# Patient Record
Sex: Female | Born: 1970 | Race: White | Hispanic: No | Marital: Married | Smoking: Never smoker
Health system: Southern US, Community
[De-identification: ages and names within clinical notes are randomized; demographics above are authoritative.]

## PROBLEM LIST (undated history)

## (undated) DIAGNOSIS — N201 Calculus of ureter: Secondary | ICD-10-CM

## (undated) DIAGNOSIS — Z973 Presence of spectacles and contact lenses: Secondary | ICD-10-CM

## (undated) DIAGNOSIS — Z8619 Personal history of other infectious and parasitic diseases: Secondary | ICD-10-CM

## (undated) DIAGNOSIS — J302 Other seasonal allergic rhinitis: Secondary | ICD-10-CM

## (undated) DIAGNOSIS — T7840XA Allergy, unspecified, initial encounter: Secondary | ICD-10-CM

## (undated) DIAGNOSIS — Z9889 Other specified postprocedural states: Secondary | ICD-10-CM

## (undated) DIAGNOSIS — R112 Nausea with vomiting, unspecified: Secondary | ICD-10-CM

## (undated) DIAGNOSIS — K219 Gastro-esophageal reflux disease without esophagitis: Secondary | ICD-10-CM

## (undated) DIAGNOSIS — F419 Anxiety disorder, unspecified: Secondary | ICD-10-CM

## (undated) DIAGNOSIS — R351 Nocturia: Secondary | ICD-10-CM

## (undated) DIAGNOSIS — Z87898 Personal history of other specified conditions: Secondary | ICD-10-CM

## (undated) DIAGNOSIS — K9041 Non-celiac gluten sensitivity: Secondary | ICD-10-CM

## (undated) DIAGNOSIS — E079 Disorder of thyroid, unspecified: Secondary | ICD-10-CM

## (undated) DIAGNOSIS — J45991 Cough variant asthma: Secondary | ICD-10-CM

## (undated) HISTORY — DX: Allergy, unspecified, initial encounter: T78.40XA

## (undated) HISTORY — DX: Other seasonal allergic rhinitis: J30.2

## (undated) HISTORY — DX: Personal history of other infectious and parasitic diseases: Z86.19

## (undated) HISTORY — DX: Anxiety disorder, unspecified: F41.9

## (undated) HISTORY — DX: Disorder of thyroid, unspecified: E07.9

## (undated) HISTORY — PX: COLONOSCOPY: SHX174

---

## 1990-03-08 HISTORY — PX: GYNECOLOGIC CRYOSURGERY: SHX857

## 1998-11-07 HISTORY — PX: OTHER SURGICAL HISTORY: SHX169

## 1998-11-19 ENCOUNTER — Encounter (INDEPENDENT_AMBULATORY_CARE_PROVIDER_SITE_OTHER): Payer: Self-pay

## 1998-11-19 ENCOUNTER — Ambulatory Visit (HOSPITAL_COMMUNITY): Admission: RE | Admit: 1998-11-19 | Discharge: 1998-11-19 | Payer: Self-pay | Admitting: Obstetrics and Gynecology

## 1999-10-23 ENCOUNTER — Emergency Department (HOSPITAL_COMMUNITY): Admission: EM | Admit: 1999-10-23 | Discharge: 1999-10-23 | Payer: Self-pay | Admitting: Emergency Medicine

## 1999-10-23 ENCOUNTER — Encounter: Payer: Self-pay | Admitting: Internal Medicine

## 2000-06-20 ENCOUNTER — Other Ambulatory Visit: Admission: RE | Admit: 2000-06-20 | Discharge: 2000-06-20 | Payer: Self-pay | Admitting: Obstetrics and Gynecology

## 2000-11-29 ENCOUNTER — Inpatient Hospital Stay (HOSPITAL_COMMUNITY): Admission: AD | Admit: 2000-11-29 | Discharge: 2000-11-29 | Payer: Self-pay | Admitting: Obstetrics and Gynecology

## 2000-12-01 ENCOUNTER — Inpatient Hospital Stay (HOSPITAL_COMMUNITY): Admission: AD | Admit: 2000-12-01 | Discharge: 2000-12-01 | Payer: Self-pay | Admitting: Obstetrics and Gynecology

## 2000-12-02 ENCOUNTER — Inpatient Hospital Stay (HOSPITAL_COMMUNITY): Admission: AD | Admit: 2000-12-02 | Discharge: 2000-12-02 | Payer: Self-pay | Admitting: Obstetrics & Gynecology

## 2000-12-14 ENCOUNTER — Inpatient Hospital Stay (HOSPITAL_COMMUNITY): Admission: AD | Admit: 2000-12-14 | Discharge: 2000-12-31 | Payer: Self-pay | Admitting: *Deleted

## 2000-12-14 ENCOUNTER — Encounter (INDEPENDENT_AMBULATORY_CARE_PROVIDER_SITE_OTHER): Payer: Self-pay

## 2000-12-19 ENCOUNTER — Encounter: Payer: Self-pay | Admitting: Obstetrics and Gynecology

## 2000-12-22 ENCOUNTER — Encounter: Payer: Self-pay | Admitting: Obstetrics and Gynecology

## 2000-12-26 ENCOUNTER — Encounter: Payer: Self-pay | Admitting: Obstetrics and Gynecology

## 2001-01-01 ENCOUNTER — Encounter: Admission: RE | Admit: 2001-01-01 | Discharge: 2001-01-31 | Payer: Self-pay | Admitting: Obstetrics and Gynecology

## 2001-01-26 ENCOUNTER — Other Ambulatory Visit: Admission: RE | Admit: 2001-01-26 | Discharge: 2001-01-26 | Payer: Self-pay | Admitting: Obstetrics and Gynecology

## 2001-02-01 ENCOUNTER — Encounter: Admission: RE | Admit: 2001-02-01 | Discharge: 2001-03-03 | Payer: Self-pay | Admitting: Obstetrics and Gynecology

## 2001-04-03 ENCOUNTER — Encounter: Admission: RE | Admit: 2001-04-03 | Discharge: 2001-05-03 | Payer: Self-pay | Admitting: Obstetrics and Gynecology

## 2001-05-04 ENCOUNTER — Encounter: Admission: RE | Admit: 2001-05-04 | Discharge: 2001-06-03 | Payer: Self-pay | Admitting: Obstetrics and Gynecology

## 2002-01-23 ENCOUNTER — Other Ambulatory Visit: Admission: RE | Admit: 2002-01-23 | Discharge: 2002-01-23 | Payer: Self-pay | Admitting: Obstetrics and Gynecology

## 2002-03-08 HISTORY — PX: CHOLECYSTECTOMY: SHX55

## 2002-12-25 ENCOUNTER — Encounter: Admission: RE | Admit: 2002-12-25 | Discharge: 2002-12-25 | Payer: Self-pay | Admitting: *Deleted

## 2002-12-25 ENCOUNTER — Encounter: Payer: Self-pay | Admitting: *Deleted

## 2002-12-26 ENCOUNTER — Emergency Department (HOSPITAL_COMMUNITY): Admission: EM | Admit: 2002-12-26 | Discharge: 2002-12-26 | Payer: Self-pay | Admitting: *Deleted

## 2002-12-26 ENCOUNTER — Encounter: Payer: Self-pay | Admitting: Emergency Medicine

## 2003-01-25 ENCOUNTER — Other Ambulatory Visit: Admission: RE | Admit: 2003-01-25 | Discharge: 2003-01-25 | Payer: Self-pay | Admitting: Obstetrics and Gynecology

## 2004-01-08 ENCOUNTER — Emergency Department (HOSPITAL_COMMUNITY): Admission: EM | Admit: 2004-01-08 | Discharge: 2004-01-08 | Payer: Self-pay | Admitting: Emergency Medicine

## 2006-03-08 HISTORY — PX: INTRAUTERINE DEVICE INSERTION: SHX323

## 2007-08-16 ENCOUNTER — Encounter: Admission: RE | Admit: 2007-08-16 | Discharge: 2007-08-16 | Payer: Self-pay | Admitting: Family Medicine

## 2008-05-08 ENCOUNTER — Emergency Department (HOSPITAL_COMMUNITY): Admission: EM | Admit: 2008-05-08 | Discharge: 2008-05-08 | Payer: Self-pay | Admitting: Emergency Medicine

## 2010-07-24 NOTE — H&P (Signed)
[facility]  Patient:    Carmen Simon, Carmen Simon Visit Number: [id number] MRN: [id number]          Service Type: OBS Location: 910D 9127 01 Attending Physician:  Ermalene Searing Dictated by:   Lenoard Aden, M.D. Admit Date:  12/14/2000   CC:         [facility]   History and Physical  CHIEF COMPLAINT:              Rule out preeclampsia.  HISTORY OF PRESENT ILLNESS:   A 40 year old white female, G1, P0, EDD February 01, 2001, at 33-0/7 week twin, dichorionic-diamniotic gestation who presents for persistent abnormal LFTs, elevated blood pressure, to rule out preeclampsia.  PAST MEDICAL HISTORY:         Remarkable for a noncontributory obstetric history.  Remarkable for migraine headaches and mitral valve prolapse previously premedicating with oral surgery.  ALLERGIES:                    ASPIRIN and ERYTHROMYCIN.  MEDICATIONS:                  Prenatal vitamins and Procardia.  PAST SURGICAL HISTORY:        Cryosurgery in 1992, D&C, hysteroscopy, polypectomy in September 2000.  GYNECOLOGIC HISTORY:          History of conception on Clomid, history of HPV.  FAMILY HISTORY:               Tuberculosis, diet-controlled diabetes.  PRENATAL LABORATORY DATA:     Blood type A negative, Rh antibody negative, rubella immune, hepatitis B surface antigen negative, HIV nonreactive, and GC and chlamydia negative.  Triple screen performed at 16 to 18 weeks reportedly normal for a twin gestation.  PREGNANCY ISSUES:              Preterm cervical change treated with Procardia.  Gestational hypertension stable.  Discordant growth between twin A and twin B. Fetal positioning: Twin A is vertex; twin B is breach.  Fetal arrhythmia of twin A with normal targeted ultrasound and fetal echocardiogram.  Abnormal liver function tests.  SOCIAL HISTORY:               Noncontributory.  PHYSICAL EXAMINATION:  GENERAL:                      She is a well-developed,  well-nourished white female in no apparent distress.  VITAL SIGNS:                  Blood pressure 140/86.  HEENT:                        Normal.  LUNGS:                        Clear.  HEART:                        Regular rate and rhythm.  ABDOMEN:                      Soft, gravid, nontender.  Fundal height 40.  No right upper quadrant tenderness noted.  PELVIC:                       Cervix is 2 cm, long, soft vertex, and -1.  EXTREMITIES:                  DTRs 1 to 2+, no evidence of clonus.  LABORATORY DATA:              Surveillance reveals a biophysical profile of 8/8 of twin A with a normal AFI, and 6/8 twin B.  Most recent sonographic estimates of fetal weight reveal an estimated fetal weight on December 07, 2000, of twin A of 4 pounds 4 ounces and twin B of 3 pounds 11 ounces.  LFTs obtained on December 12, 2000, reveal an SGOT of 46, mildly elevated; SGPT 123.  Otherwise normal CBC and PIH labs.  Elevations of SGPT have been persistent with mild elevations noted over a two-week period since December 01, 2000.  IMPRESSION:                   1. A 33-week twin dichorionic-diamniotic                                  gestation.                               2. Gestational hypertension with good response                                  to Procardia.                               3. Preterm cervical change.                               4. Rhesus (Rh) negative.                               5. Fetal arrhythmia of twin A.                               6. Vertex-breach presentation.  PLAN:                         Admit to [facility] for expectant management, 24-hour urine, serial CBC and PIH labs.  Continue Procardia at this time.  NICU consultation ordered. If active labor ensues, the patient desires primary C section due to fetal positioning considerations and does not desire any possibility for attempts at breach delivery. Dictated by:   Lenoard Aden,  M.D. Attending Physician:  Marina Gravel B DD:  12/14/00 TD:  12/14/00 Job: [id number]

## 2010-07-24 NOTE — Discharge Summary (Signed)
[facility]  Patient:    AIKA, BRZOSKA Visit Number: [id number] MRN: [id number]          Service Type: OBS Location: 910A 9111 01 Attending Physician:  Ermalene Searing Admit Date:  12/14/2000 Discharge Date: 12/31/2000                             Discharge Summary  HISTORY OF PRESENT ILLNESS:   The patient was admitted for atypical preeclampsia on December 14, 2000, for abnormal LFTs.  All other preeclamptic surveillance within normal limits.  HOSPITAL COURSE:              She was followed in the hospital due to the abnormal liver function tests and borderline blood pressure.  She remained on Procardia.  She had ongoing reassuring surveillance for her twins.  She remained on Procardia until 35 weeks, at which time her laboratory values continued to worsen.  Her blood pressure elevated and IUGR of twin B was noted. Therefore, she underwent an uncomplicated primary low transverse cesarean section for twins on December 28, 2000.  Postoperatively, she was not placed on magnesium sulfate due to resolution of her blood pressures and improvement in her lab values.  She was discharged to home on postoperative day #3 with marked improvement noted in her liver function tests.  She was given Motrin and Tylox for pain.  Preeclamptic warnings were given.  FOLLOW-UP:                    Follow up in the office in one week. Attending Physician:  Marina Gravel B DD:  01/23/01 TD:  01/23/01 Job: [id number]

## 2010-07-24 NOTE — Op Note (Signed)
[facility]  Patient:    Carmen Simon, Carmen Simon Visit Number: [id number] MRN: [id number]          Service Type: OBS Location: 9400 9178 01 Attending Physician:  Ermalene Searing Dictated by:   Lenoard Aden, M.D. Proc. Date: 12/28/00 Admit Date:  12/14/2000   CC:         [facility]   Operative Report  PREOPERATIVE DIAGNOSES:       1. A 35-week twin intrauterine pregnancy.                               2. Third trimester bleeding.                               3. Atypical preeclampsia with abnormal liver                                  enzymes.                               4. Malpresentation of Twin B.                               5. Intrauterine growth restriction Twin B.  POSTOPERATIVE DIAGNOSES:      1. A 35-week twin intrauterine pregnancy.                               2. Third trimester bleeding.                               3. Atypical preeclampsia with abnormal liver                                  enzymes.                               4. Malpresentation of Twin B.                               5. Intrauterine growth restriction Twin B.  PROCEDURE:                    Primary low segment transverse cesarean section.  SURGEON:                      Lenoard Aden, M.D.  ASSISTANT:                    Silverio Lay, M.D.  ANESTHESIA:                   Spinal by Dr. Jean Rosenthal.  ESTIMATED BLOOD LOSS:         1000 cc.  DRAINS:                       Foley.  COUNTS:  Correct.  DISPOSITION:                  The patient to recovery in good condition.  FINDINGS:                     Full-term living female, vertex presenting, Apgars 6 and 7 to the NICU, and full-term living female, breech position, Apgars 8 and 9, to the NICU for size restrictions. Placenta posterior, intact. Normal tubes and ovaries.  DESCRIPTION OF PROCEDURE:     After being apprised of the risks of anesthesia, infection, bleeding, injury to  abdominal organs with need for repair, the patient was brought to the operating room where she was administered general anesthetic without complications, prepped and draped in the usual sterile fashion, and Foley catheter placed. After achieving adequate anesthesia, Marcaine solution was placed in the area of the Pfannenstiel incision, the fascia skin incision made with scalpel, carried down to the fascia which was nicked in the midline and opened transversely using Mayo scissors. Rectus muscles dissected sharply in the midline, peritoneum entered sharply, and bladder blade placed. Visceral peritoneum scored in a a smile-like fashion. Uterus scored in a smile-like fashion, amniotomy for clear fluid and delivery of Twin A from vertex presentation, handed to the pediatricians in attendance. Apgars were 6 and 7. Twin B, frank breech positioning, amniotomy for clear fluid, delivered using standard maneuvers for the delivery of the fetal breech with flexion of the fetal vertex, bulb suctioning upon delivery, and handed to the pediatricians in attendance. Apgars were 8 and 9. Placenta was posterior, lateral, and intact, sent to pathology after cord blood collected. The uterus was exteriorized. Normal tubes and ovaries and normal uterine cavity noted. The uterus was closed using a O Monocryl in continuous running fashion. A second imbricating layer was placed. The bladder flap was inspected. Paracolic gutters were irrigated and all blood clots subsequently removed. The uterus was replaced. Good hemostasis was noted along the uterine incision as well as the bladder flap. Exploration revealed no evidence of peritoneal edge bleeding. The fascia was then closed using a O Vicryl in continuous running fashion. The skin was closed using staples. The patient tolerated the procedure well and was transferred to recovery in good condition. Dictated by:   Lenoard Aden, M.D. Attending Physician:  Marina Gravel B DD:  12/28/00 TD:  12/28/00 Job: [id number]

## 2010-10-27 ENCOUNTER — Other Ambulatory Visit (HOSPITAL_COMMUNITY): Payer: Self-pay | Admitting: Family Medicine

## 2010-10-27 ENCOUNTER — Encounter (INDEPENDENT_AMBULATORY_CARE_PROVIDER_SITE_OTHER): Payer: BC Managed Care – PPO

## 2010-10-27 ENCOUNTER — Ambulatory Visit (HOSPITAL_COMMUNITY): Payer: BC Managed Care – PPO | Attending: Family Medicine

## 2010-10-27 DIAGNOSIS — R609 Edema, unspecified: Secondary | ICD-10-CM

## 2010-10-27 DIAGNOSIS — R002 Palpitations: Secondary | ICD-10-CM

## 2010-10-27 DIAGNOSIS — E669 Obesity, unspecified: Secondary | ICD-10-CM | POA: Insufficient documentation

## 2010-10-27 HISTORY — PX: TRANSTHORACIC ECHOCARDIOGRAM: SHX275

## 2010-10-28 ENCOUNTER — Encounter (HOSPITAL_COMMUNITY): Payer: Self-pay | Admitting: Cardiology

## 2011-04-06 ENCOUNTER — Other Ambulatory Visit: Payer: Self-pay | Admitting: Obstetrics and Gynecology

## 2011-04-06 DIAGNOSIS — Z1231 Encounter for screening mammogram for malignant neoplasm of breast: Secondary | ICD-10-CM

## 2011-04-14 ENCOUNTER — Ambulatory Visit
Admission: RE | Admit: 2011-04-14 | Discharge: 2011-04-14 | Disposition: A | Discharge status: Home / Self Care | Payer: BC Managed Care – PPO | Source: Ambulatory Visit | Attending: Obstetrics and Gynecology | Admitting: Obstetrics and Gynecology

## 2011-04-14 DIAGNOSIS — Z1231 Encounter for screening mammogram for malignant neoplasm of breast: Secondary | ICD-10-CM

## 2012-04-05 ENCOUNTER — Other Ambulatory Visit: Payer: Self-pay | Admitting: Obstetrics and Gynecology

## 2012-04-05 DIAGNOSIS — Z1231 Encounter for screening mammogram for malignant neoplasm of breast: Secondary | ICD-10-CM

## 2012-05-01 ENCOUNTER — Ambulatory Visit
Admission: RE | Admit: 2012-05-01 | Discharge: 2012-05-01 | Disposition: A | Discharge status: Home / Self Care | Payer: BC Managed Care – PPO | Source: Ambulatory Visit | Attending: Obstetrics and Gynecology | Admitting: Obstetrics and Gynecology

## 2012-05-01 DIAGNOSIS — Z1231 Encounter for screening mammogram for malignant neoplasm of breast: Secondary | ICD-10-CM

## 2012-06-29 DIAGNOSIS — G5621 Lesion of ulnar nerve, right upper limb: Secondary | ICD-10-CM | POA: Insufficient documentation

## 2012-06-29 DIAGNOSIS — S63599A Other specified sprain of unspecified wrist, initial encounter: Secondary | ICD-10-CM | POA: Insufficient documentation

## 2012-07-06 HISTORY — PX: ULNAR TUNNEL RELEASE: SHX820

## 2013-06-15 ENCOUNTER — Other Ambulatory Visit: Payer: Self-pay

## 2013-06-15 DIAGNOSIS — Z1231 Encounter for screening mammogram for malignant neoplasm of breast: Secondary | ICD-10-CM

## 2013-06-25 ENCOUNTER — Ambulatory Visit: Payer: BC Managed Care – PPO

## 2013-06-27 ENCOUNTER — Ambulatory Visit
Admission: RE | Admit: 2013-06-27 | Discharge: 2013-06-27 | Disposition: A | Discharge status: Home / Self Care | Payer: BC Managed Care – PPO | Source: Ambulatory Visit

## 2013-06-27 DIAGNOSIS — Z1231 Encounter for screening mammogram for malignant neoplasm of breast: Secondary | ICD-10-CM

## 2013-07-10 ENCOUNTER — Other Ambulatory Visit: Payer: Self-pay | Admitting: Dermatology

## 2014-03-11 ENCOUNTER — Emergency Department (HOSPITAL_COMMUNITY)
Admission: EM | Admit: 2014-03-11 | Discharge: 2014-03-11 | Disposition: A | Discharge status: Home / Self Care | Payer: BC Managed Care – PPO | Attending: Emergency Medicine | Admitting: Emergency Medicine

## 2014-03-11 ENCOUNTER — Emergency Department (HOSPITAL_COMMUNITY): Payer: BC Managed Care – PPO

## 2014-03-11 ENCOUNTER — Encounter (HOSPITAL_COMMUNITY): Payer: Self-pay | Admitting: Emergency Medicine

## 2014-03-11 DIAGNOSIS — N200 Calculus of kidney: Secondary | ICD-10-CM | POA: Diagnosis not present

## 2014-03-11 DIAGNOSIS — Z3202 Encounter for pregnancy test, result negative: Secondary | ICD-10-CM | POA: Insufficient documentation

## 2014-03-11 DIAGNOSIS — J45909 Unspecified asthma, uncomplicated: Secondary | ICD-10-CM | POA: Diagnosis not present

## 2014-03-11 DIAGNOSIS — R1032 Left lower quadrant pain: Secondary | ICD-10-CM | POA: Diagnosis present

## 2014-03-11 LAB — CBC WITH DIFFERENTIAL/PLATELET
Basophils Absolute: 0 10*3/uL (ref 0.0–0.1)
Basophils Relative: 0 % (ref 0–1)
EOS ABS: 0.2 10*3/uL (ref 0.0–0.7)
EOS PCT: 1 % (ref 0–5)
HEMATOCRIT: 39.2 % (ref 36.0–46.0)
HEMOGLOBIN: 12.8 g/dL (ref 12.0–15.0)
Lymphocytes Relative: 22 % (ref 12–46)
Lymphs Abs: 2.6 10*3/uL (ref 0.7–4.0)
MCH: 29.2 pg (ref 26.0–34.0)
MCHC: 32.7 g/dL (ref 30.0–36.0)
MCV: 89.5 fL (ref 78.0–100.0)
Monocytes Absolute: 0.9 10*3/uL (ref 0.1–1.0)
Monocytes Relative: 7 % (ref 3–12)
NEUTROS PCT: 70 % (ref 43–77)
Neutro Abs: 8.4 10*3/uL — ABNORMAL HIGH (ref 1.7–7.7)
PLATELETS: 308 10*3/uL (ref 150–400)
RBC: 4.38 MIL/uL (ref 3.87–5.11)
RDW: 13.3 % (ref 11.5–15.5)
WBC: 12.1 10*3/uL — AB (ref 4.0–10.5)

## 2014-03-11 LAB — I-STAT CHEM 8, ED
BUN: 17 mg/dL (ref 6–23)
CHLORIDE: 104 meq/L (ref 96–112)
CREATININE: 0.8 mg/dL (ref 0.50–1.10)
Calcium, Ion: 1.07 mmol/L — ABNORMAL LOW (ref 1.12–1.23)
Glucose, Bld: 127 mg/dL — ABNORMAL HIGH (ref 70–99)
HCT: 37 % (ref 36.0–46.0)
Hemoglobin: 12.6 g/dL (ref 12.0–15.0)
POTASSIUM: 3.6 mmol/L (ref 3.5–5.1)
SODIUM: 138 mmol/L (ref 135–145)
TCO2: 20 mmol/L (ref 0–100)

## 2014-03-11 LAB — URINALYSIS, ROUTINE W REFLEX MICROSCOPIC
Bilirubin Urine: NEGATIVE
Glucose, UA: NEGATIVE mg/dL
Ketones, ur: NEGATIVE mg/dL
NITRITE: NEGATIVE
PH: 7.5 (ref 5.0–8.0)
Protein, ur: 30 mg/dL — AB
SPECIFIC GRAVITY, URINE: 1.025 (ref 1.005–1.030)
UROBILINOGEN UA: 1 mg/dL (ref 0.0–1.0)

## 2014-03-11 LAB — URINE MICROSCOPIC-ADD ON

## 2014-03-11 LAB — POC URINE PREG, ED: PREG TEST UR: NEGATIVE

## 2014-03-11 MED ORDER — MORPHINE SULFATE 4 MG/ML IJ SOLN
4.0000 mg | Freq: Once | INTRAMUSCULAR | Status: AC
  Administered 2014-03-11: 4 mg via INTRAVENOUS
  Filled 2014-03-11: qty 1

## 2014-03-11 MED ORDER — SODIUM CHLORIDE 0.9 % IV BOLUS (SEPSIS)
1000.0000 mL | Freq: Once | INTRAVENOUS | Status: AC
  Administered 2014-03-11: 1000 mL via INTRAVENOUS

## 2014-03-11 MED ORDER — KETOROLAC TROMETHAMINE 30 MG/ML IJ SOLN
30.0000 mg | Freq: Once | INTRAMUSCULAR | Status: AC
Start: 2014-03-11 — End: 2014-03-11
  Administered 2014-03-11: 30 mg via INTRAVENOUS
  Filled 2014-03-11: qty 1

## 2014-03-11 MED ORDER — ONDANSETRON HCL 4 MG/2ML IJ SOLN
4.0000 mg | Freq: Once | INTRAMUSCULAR | Status: AC
Start: 2014-03-11 — End: 2014-03-11
  Administered 2014-03-11: 4 mg via INTRAVENOUS
  Filled 2014-03-11: qty 2

## 2014-03-11 NOTE — ED Notes (Addendum)
Awake. Verbally responsive. A/O x4. Resp even and unlabored. No audible adventitious breath sounds noted. ABC's intact. Lt flank pain that radiates to LLQ area with nausea. No hematuria. Denies dysuria. Denies urinary frequency/urgency, burning/pressure with void.

## 2014-03-11 NOTE — ED Notes (Signed)
Awake. Verbally responsive. A/O x4. Resp even and unlabored. No audible adventitious breath sounds noted. ABC's intact. NAD noted. 

## 2014-03-11 NOTE — ED Notes (Signed)
Awake. Verbally responsive. A/O x4. Resp even and unlabored. No audible adventitious breath sounds noted. ABC's intact.  

## 2014-03-11 NOTE — Discharge Instructions (Signed)
Kidney Stones Carmen Simon, you were seen today for abdominal pain. This is likely due to kidney stones. Your ultrasound did not show significant swelling of your kidneys. Continue to take Motrin at home as needed for pain and follow-up with urology for continued management. If symptoms worsen come back to the emergency department immediately. Thank you. Kidney stones (urolithiasis) are solid masses that form inside your kidneys. The intense pain is caused by the stone moving through the kidney, ureter, bladder, and urethra (urinary tract). When the stone moves, the ureter starts to spasm around the stone. The stone is usually passed in your pee (urine).  HOME CARE  Drink enough fluids to keep your pee clear or pale yellow. This helps to get the stone out.  Strain all pee through the provided strainer. Do not pee without peeing through the strainer, not even once. If you pee the stone out, catch it in the strainer. The stone may be as small as a grain of salt. Take this to your doctor. This will help your doctor figure out what you can do to try to prevent more kidney stones.  Only take medicine as told by your doctor.  Follow up with your doctor as told.  Get follow-up X-rays as told by your doctor. GET HELP IF: You have pain that gets worse even if you have been taking pain medicine. GET HELP RIGHT AWAY IF:   Your pain does not get better with medicine.  You have a fever or shaking chills.  Your pain increases and gets worse over 18 hours.  You have new belly (abdominal) pain.  You feel faint or pass out.  You are unable to pee. MAKE SURE YOU:   Understand these instructions.  Will watch your condition.  Will get help right away if you are not doing well or get worse. Document Released: 08/11/2007 Document Revised: 10/25/2012 Document Reviewed: 07/26/2012 [facility] Patient Information 2015 [place], [place]. This information is not intended to replace advice given to you by your  health care provider. Make sure you discuss any questions you have with your health care provider.

## 2014-03-11 NOTE — ED Provider Notes (Signed)
CSN: [id number]     Arrival date & time 03/11/14  0241 History   First MD Initiated Contact with Patient 03/11/14 [phone]     Chief Complaint  Patient presents with  . Flank Pain     (Consider location/radiation/quality/duration/timing/severity/associated sxs/prior Treatment) HPI  Carmen Simon is a 44 y.o. female with past medical history of asthma, nephrolithiasis coming in with sudden onset left sided abdominal pain. Patient states this occurred at 1 AM while sleeping and was worse in her left lower quadrant. The pain was sharp. It radiated into her left flank, and now radiates into her left groin. Pain has changed to a dull constant pain. She's had nausea with no vomiting. States her urine has been darker, she denies dysuria or hematuria. Bowel movements have been normal. She states she's had an abnormal menstrual cycle this month, she has an IUD in place. She denies any abnormal vaginal discharge. She's had no fevers or recent infections. Patient denies any history of pain like this. She had a kidney stone 3 years ago on the right side which was 1 mm and she suspects that it passed spontaneously.  She had no further issues with this at that time.  10 Systems reviewed and are negative for acute change except as noted in the HPI.      Past Medical History  Diagnosis Date  . Asthma    No past surgical history on file. No family history on file. History  Substance Use Topics  . Smoking status: Never Smoker   . Smokeless tobacco: Never Used  . Alcohol Use: Yes   OB History    No data available     Review of Systems    Allergies  Aspirin and Gluten meal  Home Medications   Prior to Admission medications   Not on File   BP 158/85 mmHg  Pulse 89  Temp(Src) 97.4 F (36.3 C) (Oral)  Resp 20  Ht 5\' 4"  (1.626 m)  Wt 184 lb (83.462 kg)  BMI 31.57 kg/m2  SpO2 100% Physical Exam  Constitutional: She is oriented to person, place, and time. She appears well-developed and  well-nourished. No distress.  HENT:  Head: Normocephalic and atraumatic.  Nose: Nose normal.  Mouth/Throat: Oropharynx is clear and moist. No oropharyngeal exudate.  Eyes: Conjunctivae and EOM are normal. Pupils are equal, round, and reactive to light. No scleral icterus.  Neck: Normal range of motion. Neck supple. No JVD present. No tracheal deviation present. No thyromegaly present.  Cardiovascular: Normal rate, regular rhythm and normal heart sounds.  Exam reveals no gallop and no friction rub.   No murmur heard. Pulmonary/Chest: Effort normal and breath sounds normal. No respiratory distress. She has no wheezes. She exhibits no tenderness.  Abdominal: Soft. Bowel sounds are normal. She exhibits no distension and no mass. There is no tenderness. There is no rebound and no guarding.  No CVA tenderness  Musculoskeletal: Normal range of motion. She exhibits no edema or tenderness.  Lymphadenopathy:    She has no cervical adenopathy.  Neurological: She is alert and oriented to person, place, and time. No cranial nerve deficit. She exhibits normal muscle tone.  Skin: Skin is warm and dry. No rash noted. She is not diaphoretic. No erythema. No pallor.  Nursing note and vitals reviewed.   ED Course  Procedures (including critical care time) Labs Review Labs Reviewed  CBC WITH DIFFERENTIAL - Abnormal; Notable for the following:    WBC 12.1 (*)    Neutro  Abs 8.4 (*)    All other components within normal limits  URINALYSIS, ROUTINE W REFLEX MICROSCOPIC - Abnormal; Notable for the following:    APPearance CLOUDY (*)    Hgb urine dipstick LARGE (*)    Protein, ur 30 (*)    Leukocytes, UA SMALL (*)    All other components within normal limits  URINE MICROSCOPIC-ADD ON - Abnormal; Notable for the following:    Bacteria, UA FEW (*)    All other components within normal limits  I-STAT CHEM 8, ED - Abnormal; Notable for the following:    Glucose, Bld 127 (*)    Calcium, Ion 1.07 (*)    All  other components within normal limits  URINE CULTURE  POC URINE PREG, ED    Imaging Review US Renal  03/11/2014   CLINICAL DATA:  Left flank pain.  EXAM: RENAL/URINARY TRACT ULTRASOUND COMPLETE  COMPARISON:  None.  FINDINGS: Right Kidney:  Length: 10.4 cm. Echogenicity within normal limits. No mass or hydronephrosis visualized.  Left Kidney:  Length: 11.1 cm. Echogenicity within normal limits. No mass or hydronephrosis visualized.  Bladder:  Appears normal for degree of bladder distention. No wall thickening or filling defect identified appear  IMPRESSION: Normal ultrasound appearance of the kidneys and bladder.   Electronically Signed   By: Lucienne Capers M.D.   On: 03/11/2014 05:08     EKG Interpretation None      MDM   Final diagnoses:  Nephrolithiasis    Patient since emergency department for sudden onset left-sided flank pain with radiation into her groin. History is consistent with nephrolithiasis. She was given IV fluids, morphine, Toradol and Zofran for relief. Renal ultrasound is currently pending. Also will obtain hCG as she has had abnormal vaginal bleeding this month and IUD puts her at increased risk for ectopic pregnancy.  Place a test is negative, renal ultrasound does not show any hydronephrosis. After morphine and Toradol patient's symptoms have improved, she is currently denying pain. She is advised to continue Motrin at home as needed for pain. She'll be given urology follow-up, her vital signs were within her normal limits and she is safe for discharge.   [name], MD 03/11/14 [phone]

## 2014-03-11 NOTE — ED Notes (Signed)
Oni, MD at bedside.  

## 2014-03-11 NOTE — ED Notes (Signed)
Pt arrived to the ED with a complaint of left sided flank pain.  Pt is present in the left lower pelvic area and the left lower back area.  Pt was awoke today around 0100 hrs.  Pt describes the pain as constant and sharp.

## 2014-03-11 NOTE — ED Notes (Signed)
Awake. Verbally responsive. A/O x4. Resp even and unlabored. No audible adventitious breath sounds noted. ABC's intact. IV saline lock patent and intact. IV flds infused without difficulty. Family at bedside.

## 2014-03-12 LAB — URINE CULTURE

## 2014-04-09 ENCOUNTER — Other Ambulatory Visit: Payer: Self-pay | Admitting: Urology

## 2014-04-16 ENCOUNTER — Encounter (HOSPITAL_BASED_OUTPATIENT_CLINIC_OR_DEPARTMENT_OTHER): Payer: Self-pay | Admitting: *Deleted

## 2014-04-16 NOTE — Progress Notes (Signed)
NPO AFTER MN. ARRIVE AT 0930. NEEDS ISTAT AND URINE PREG. WILL TAKE ZYRTEC AND FLOMAX AM DOS W/ SIPS OF WATER.

## 2014-04-18 ENCOUNTER — Ambulatory Visit (HOSPITAL_BASED_OUTPATIENT_CLINIC_OR_DEPARTMENT_OTHER): Payer: BC Managed Care – PPO | Admitting: Anesthesiology

## 2014-04-18 ENCOUNTER — Other Ambulatory Visit: Payer: Self-pay | Admitting: Urology

## 2014-04-18 ENCOUNTER — Encounter (HOSPITAL_BASED_OUTPATIENT_CLINIC_OR_DEPARTMENT_OTHER)
Admission: RE | Disposition: A | Discharge status: Home / Self Care | Payer: Self-pay | Source: Ambulatory Visit | Attending: Urology

## 2014-04-18 ENCOUNTER — Ambulatory Visit (HOSPITAL_COMMUNITY)
Admission: RE | Admit: 2014-04-18 | Discharge: 2014-04-18 | Disposition: A | Discharge status: Home / Self Care | Payer: BC Managed Care – PPO | Source: Ambulatory Visit | Attending: Urology | Admitting: Urology

## 2014-04-18 ENCOUNTER — Encounter (HOSPITAL_BASED_OUTPATIENT_CLINIC_OR_DEPARTMENT_OTHER): Payer: Self-pay | Admitting: *Deleted

## 2014-04-18 ENCOUNTER — Ambulatory Visit (HOSPITAL_BASED_OUTPATIENT_CLINIC_OR_DEPARTMENT_OTHER)
Admission: RE | Admit: 2014-04-18 | Discharge: 2014-04-18 | Disposition: A | Discharge status: Home / Self Care | Payer: BC Managed Care – PPO | Source: Ambulatory Visit | Attending: Urology | Admitting: Urology

## 2014-04-18 DIAGNOSIS — N200 Calculus of kidney: Secondary | ICD-10-CM

## 2014-04-18 DIAGNOSIS — N201 Calculus of ureter: Secondary | ICD-10-CM | POA: Insufficient documentation

## 2014-04-18 DIAGNOSIS — Z79899 Other long term (current) drug therapy: Secondary | ICD-10-CM | POA: Insufficient documentation

## 2014-04-18 DIAGNOSIS — K219 Gastro-esophageal reflux disease without esophagitis: Secondary | ICD-10-CM | POA: Insufficient documentation

## 2014-04-18 DIAGNOSIS — Z538 Procedure and treatment not carried out for other reasons: Secondary | ICD-10-CM | POA: Insufficient documentation

## 2014-04-18 DIAGNOSIS — J45909 Unspecified asthma, uncomplicated: Secondary | ICD-10-CM | POA: Diagnosis not present

## 2014-04-18 HISTORY — DX: Other specified postprocedural states: Z98.890

## 2014-04-18 HISTORY — DX: Non-celiac gluten sensitivity: K90.41

## 2014-04-18 HISTORY — DX: Personal history of other infectious and parasitic diseases: Z86.19

## 2014-04-18 HISTORY — DX: Calculus of ureter: N20.1

## 2014-04-18 HISTORY — DX: Cough variant asthma: J45.991

## 2014-04-18 HISTORY — DX: Nocturia: R35.1

## 2014-04-18 HISTORY — DX: Gastro-esophageal reflux disease without esophagitis: K21.9

## 2014-04-18 HISTORY — DX: Nausea with vomiting, unspecified: R11.2

## 2014-04-18 HISTORY — DX: Personal history of other specified conditions: Z87.898

## 2014-04-18 HISTORY — DX: Presence of spectacles and contact lenses: Z97.3

## 2014-04-18 LAB — POCT I-STAT 4, (NA,K, GLUC, HGB,HCT)
Glucose, Bld: 92 mg/dL (ref 70–99)
HEMATOCRIT: 40 % (ref 36.0–46.0)
Hemoglobin: 13.6 g/dL (ref 12.0–15.0)
Potassium: 3.7 mmol/L (ref 3.5–5.1)
Sodium: 141 mmol/L (ref 135–145)

## 2014-04-18 LAB — POCT PREGNANCY, URINE: Preg Test, Ur: NEGATIVE

## 2014-04-18 SURGERY — Surgical Case
Anesthesia: General

## 2014-04-18 MED ORDER — CIPROFLOXACIN IN D5W 400 MG/200ML IV SOLN
INTRAVENOUS | Status: AC
  Filled 2014-04-18: qty 200

## 2014-04-18 MED ORDER — LACTATED RINGERS IV SOLN
INTRAVENOUS | Status: DC
  Administered 2014-04-18: 10:00:00 via INTRAVENOUS
  Filled 2014-04-18: qty 1000

## 2014-04-18 MED ORDER — SCOPOLAMINE 1 MG/3DAYS TD PT72
MEDICATED_PATCH | TRANSDERMAL | Status: AC
  Filled 2014-04-18: qty 1

## 2014-04-18 MED ORDER — BELLADONNA ALKALOIDS-OPIUM 16.2-60 MG RE SUPP
RECTAL | Status: AC
  Filled 2014-04-18: qty 1

## 2014-04-18 MED ORDER — SCOPOLAMINE 1 MG/3DAYS TD PT72
1.0000 | MEDICATED_PATCH | TRANSDERMAL | Status: DC
  Administered 2014-04-18: 1.5 mg via TRANSDERMAL
  Filled 2014-04-18: qty 1

## 2014-04-18 MED ORDER — MIDAZOLAM HCL 2 MG/2ML IJ SOLN
INTRAMUSCULAR | Status: AC
  Filled 2014-04-18: qty 2

## 2014-04-18 MED ORDER — CIPROFLOXACIN IN D5W 400 MG/200ML IV SOLN
400.0000 mg | INTRAVENOUS | Status: DC
  Filled 2014-04-18: qty 200

## 2014-04-18 MED ORDER — FENTANYL CITRATE 0.05 MG/ML IJ SOLN
INTRAMUSCULAR | Status: AC
  Filled 2014-04-18: qty 2

## 2014-04-18 SURGICAL SUPPLY — 37 items
ADAPTER CATH URET PLST 4-6FR (CATHETERS) IMPLANT
BAG DRAIN URO-CYSTO SKYTR STRL (DRAIN) IMPLANT
BASKET LASER NITINOL 1.9FR (BASKET) IMPLANT
BASKET STNLS GEMINI 4WIRE 3FR (BASKET) IMPLANT
BASKET ZERO TIP NITINOL 2.4FR (BASKET) IMPLANT
CANISTER SUCT LVC 12 LTR MEDI- (MISCELLANEOUS) IMPLANT
CATH INTERMIT  6FR 70CM (CATHETERS) IMPLANT
CATH URET 5FR 28IN CONE TIP (BALLOONS)
CATH URET 5FR 28IN OPEN ENDED (CATHETERS) IMPLANT
CATH URET 5FR 70CM CONE TIP (BALLOONS) IMPLANT
CATH URET DUAL LUMEN 6-10FR 50 (CATHETERS) IMPLANT
CLOTH BEACON ORANGE TIMEOUT ST (SAFETY) IMPLANT
DRAPE CAMERA CLOSED 9X96 (DRAPES) IMPLANT
DRSG TEGADERM 2-3/8X2-3/4 SM (GAUZE/BANDAGES/DRESSINGS) IMPLANT
FIBER LASER FLEXIVA 200 (UROLOGICAL SUPPLIES) IMPLANT
FIBER LASER FLEXIVA 365 (UROLOGICAL SUPPLIES) IMPLANT
GLOVE BIO SURGEON STRL SZ7.5 (GLOVE) IMPLANT
GLOVE BIOGEL PI IND STRL 7.5 (GLOVE) IMPLANT
GLOVE BIOGEL PI INDICATOR 7.5 (GLOVE)
GLOVE SURG SS PI 7.5 STRL IVOR (GLOVE) IMPLANT
GOWN STRL REIN XL XLG (GOWN DISPOSABLE) IMPLANT
GOWN STRL REUS W/TWL LRG LVL3 (GOWN DISPOSABLE) IMPLANT
GOWN STRL REUS W/TWL XL LVL3 (GOWN DISPOSABLE) IMPLANT
GUIDEWIRE 0.038 PTFE COATED (WIRE) IMPLANT
GUIDEWIRE ANG ZIPWIRE 038X150 (WIRE) IMPLANT
GUIDEWIRE STR DUAL SENSOR (WIRE) IMPLANT
IV NS 1000ML (IV SOLUTION)
IV NS 1000ML BAXH (IV SOLUTION) IMPLANT
IV NS IRRIG 3000ML ARTHROMATIC (IV SOLUTION) IMPLANT
KIT BALLIN UROMAX 15FX10 (LABEL) IMPLANT
KIT BALLN UROMAX 15FX4 (MISCELLANEOUS) IMPLANT
KIT BALLN UROMAX 26 75X4 (MISCELLANEOUS)
NS IRRIG 500ML POUR BTL (IV SOLUTION) IMPLANT
PACK CYSTO (CUSTOM PROCEDURE TRAY) IMPLANT
SET HIGH PRES BAL DIL (LABEL)
SHEATH ACCESS URETERAL 38CM (SHEATH) IMPLANT
TUBE FEEDING 8FR 16IN STR KANG (MISCELLANEOUS) IMPLANT

## 2014-04-18 NOTE — OR Nursing (Signed)
Procedure canceled by Dr. Louis Meckel post KUB.

## 2014-04-18 NOTE — Op Note (Signed)
Case canceled after patient's KUB indicated that she had passed her stone.

## 2014-04-18 NOTE — H&P (Signed)
Reason For Visit Left renal colic and aggressive voiding symptoms   History of Present Illness This 44 year old female who has been fighting a distal ureteral stone for the past 3 weeks. She was seen last week and a KUB demonstrated a stone in the left pelvic region and the distal ureter. She reports that over the past week she has had worsening dysuria, spasm, urgency, and intermittent flank pain. She has been taking Rapaflo and myrbetriq without significant relief. She denies any gross hematuria, fevers, or chills.    Past Medical History Problems  1. History of asthma (Z87.09) 2. History of esophageal reflux (Z87.19)  Surgical History Problems  1. History of Cesarean Section 2. History of Gallbladder Surgery 3. History of Wrist Surgery  Current Meds 1. Culturelle 10 BILLION CAPS;  Therapy: (Recorded:14Jan2016) to Recorded 2. Singulair 10 MG Oral Tablet;  Therapy: (Recorded:14Jan2016) to Recorded 3. Tamsulosin HCl - 0.4 MG Oral Capsule; TAKE 1 CAPSULE Daily;  Therapy: [id number] to (Last Rx:28Jan2016)  Requested for: 28Jan2016 Ordered 4. Triamterene-HCTZ 37.5-25 MG Oral Tablet;  Therapy: (Recorded:14Jan2016) to Recorded 5. Uribel 118 MG Oral Capsule; TAKE 1 CAPSULE Twice daily PRN;  Therapy: 28Jan2016 to (Last Rx:28Jan2016) Ordered 6. Vitamin B12 TABS;  Therapy: (Recorded:14Jan2016) to Recorded 7. Vitamin D TABS;  Therapy: (Recorded:14Jan2016) to Recorded 8. Zyrtec 10 MG TABS;  Therapy: (Recorded:14Jan2016) to Recorded  Allergies Medication  1. Aspirin TABS 2. Erythromycin TABS Non-Medication  3. Gluten  Family History Problems  1. Family history of diabetes mellitus (Z83.3) : Father, Sister 2. Family history of nephrolithiasis (Z84.1) : Mother 3. Family history of tuberculosis (Z39.1) : Mother  Social History Problems  1. Alcohol use (Z78.9)   1 or 2 glasses a week 2. Caffeine use (F15.90)   1 3. Married 4. Never a smoker 5. Number of children   1 son  and 1 daughter 7. Occupation   Automotive engineer  Vitals Vital Signs [Data Includes: Last 1 Day]  Recorded: [id number] 12:56PM  Height: 5 ft 4 in Weight: 184 lb  BMI Calculated: 31.58 BSA Calculated: 1.89 Blood Pressure: 134 / 76 Temperature: 98 F Heart Rate: 91  Physical Exam Constitutional: Well nourished and well developed . No acute distress.  Pulmonary: No respiratory distress and normal respiratory rhythm and effort.  Cardiovascular: Heart rate and rhythm are normal . No peripheral edema.  Abdomen: The abdomen is soft and nontender. No masses are palpated. No CVA tenderness. No hernias are palpable. No hepatosplenomegaly noted.    Results/Data Urine [Data Includes: Last 1 Day]   [id number]  COLOR STRAW   APPEARANCE CLEAR   SPECIFIC GRAVITY <1.005   pH 5.5   GLUCOSE NEG mg/dL  BILIRUBIN NEG   KETONE NEG mg/dL  BLOOD TRACE   PROTEIN NEG mg/dL  UROBILINOGEN 0.2 mg/dL  NITRITE NEG   LEUKOCYTE ESTERASE NEG   SQUAMOUS EPITHELIAL/HPF RARE   WBC 0-2 WBC/hpf  RBC 0-2 RBC/hpf  BACTERIA NONE SEEN   CRYSTALS NONE SEEN   CASTS NONE SEEN    Urinalysis today demonstrates no microscopic hematuria, urine culture was sent.  KUB today demonstrates renal shadows bilaterally without evidence of stones within the renal pelvis. There is a calcification, 4 mm, the left UVJ. This is seen on the previous KUB and is perhaps 1 cm closer to the UO. There are no additional calcifications within the pelvis or the expected trajectory of either ureter.   Assessment Assessed  1. Calculus of left ureter (N20.1)  The patient has ongoing renal colic  and progressive voiding symptoms from a left UVJ stone.   Plan Calculus of left ureter  1. Follow-up Schedule Surgery Office  Follow-up  Status: Complete  Done: [id number] 2. KUB; Status:Resulted - Requires Verification;   Done: [id number] 12:00AM Health Maintenance  3. UA With REFLEX; [Do Not Release]; Status:Complete;   Done: [id number]  12:48PM  Discussion/Summary However the treatment options with the patient including continued medical expulsion therapy, shockwave lithotripsy, and ureteroscopy. The patient has nearly passed or 4 mm stone, and as such I urged the patient to continue with medical expulsion therapy for one additional week. We will set the patient up for ureteroscopy next week and cancel if she passes her stone. I discussed ureteroscopy with her in detail including the risks and benefits. We'll get her scheduled sometime next week.

## 2014-04-18 NOTE — Anesthesia Preprocedure Evaluation (Addendum)
Anesthesia Evaluation  Patient identified by MRN, date of birth, ID band Patient awake    Reviewed: Allergy & Precautions, NPO status , Patient's Chart, lab work & pertinent test results  History of Anesthesia Complications (+) PONV and history of anesthetic complications  Airway Mallampati: II  TM Distance: >3 FB Neck ROM: Full    Dental no notable dental hx. (+) Teeth Intact, Dental Advisory Given   Pulmonary asthma ,  breath sounds clear to auscultation  Pulmonary exam normal       Cardiovascular Exercise Tolerance: Good negative cardio ROS  Rhythm:Regular Rate:Normal     Neuro/Psych negative neurological ROS  negative psych ROS   GI/Hepatic negative GI ROS, Neg liver ROS, GERD-  Controlled,  Endo/Other  negative endocrine ROS  Renal/GU negative Renal ROS  negative genitourinary   Musculoskeletal negative musculoskeletal ROS (+)   Abdominal   Peds negative pediatric ROS (+)  Hematology negative hematology ROS (+)   Anesthesia Other Findings   Reproductive/Obstetrics negative OB ROS                           Anesthesia Physical Anesthesia Plan  ASA: II  Anesthesia Plan: General   Post-op Pain Management:    Induction: Intravenous  Airway Management Planned: LMA  Additional Equipment:   Intra-op Plan:   Post-operative Plan: Extubation in OR  Informed Consent: I have reviewed the patients History and Physical, chart, labs and discussed the procedure including the risks, benefits and alternatives for the proposed anesthesia with the patient or authorized representative who has indicated his/her understanding and acceptance.   Dental advisory given  Plan Discussed with: CRNA and Surgeon  Anesthesia Plan Comments:         Anesthesia Quick Evaluation

## 2015-03-09 HISTORY — PX: OSTEOTOMY AND ULNAR SHORTENING: SHX2140

## 2016-02-13 IMAGING — US US RENAL
1 series · 14 of 24 positions shown · non-contrast
Comparison: None.

CLINICAL DATA: Left flank pain.

EXAM:
RENAL/URINARY TRACT ULTRASOUND COMPLETE

[Series 1: us renal · 0.21mm/px · 14 of 24 slices shown]
[im 1/24]
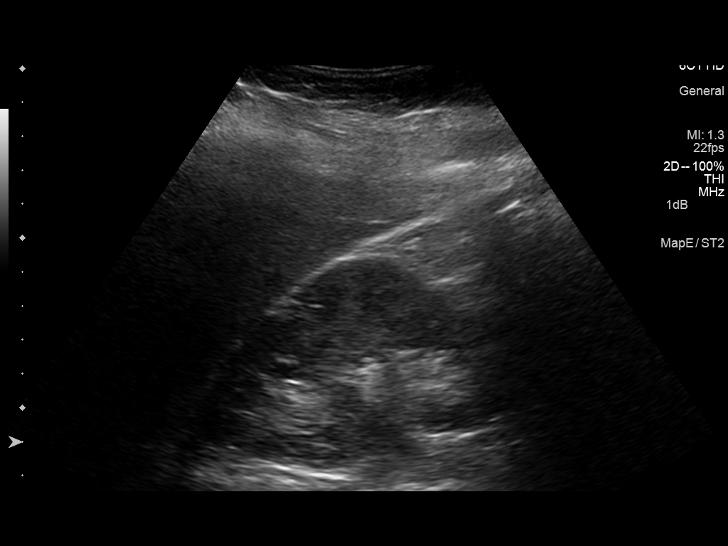
[im 3/24]
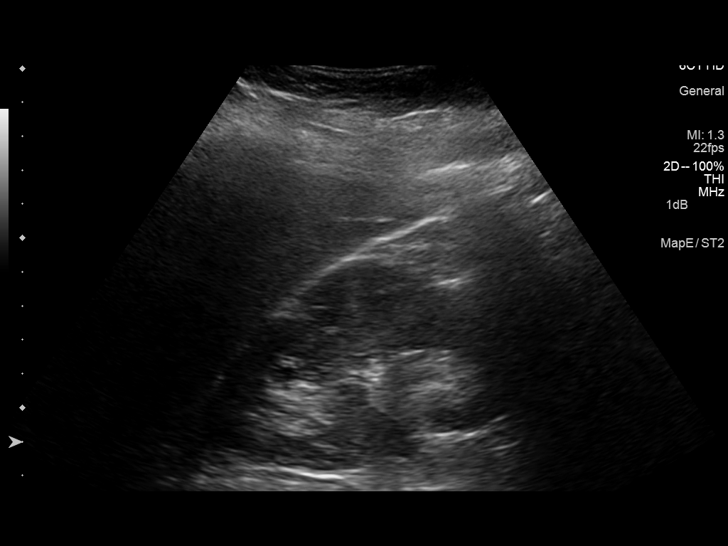
[im 5/24]
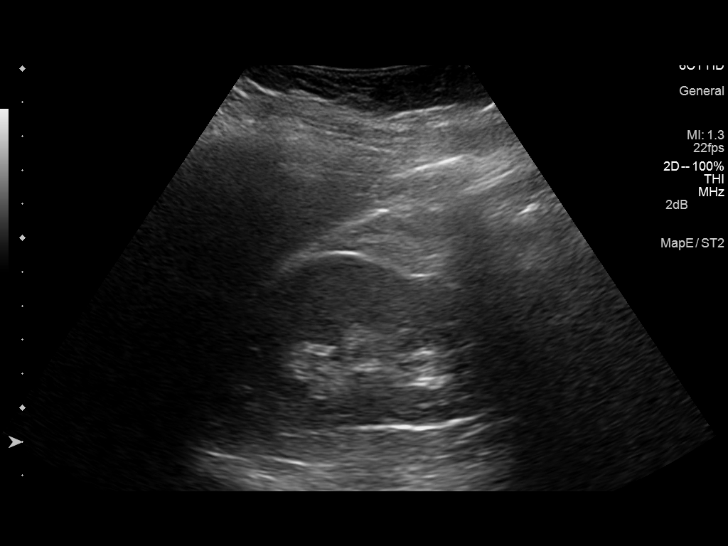
[im 7/24]
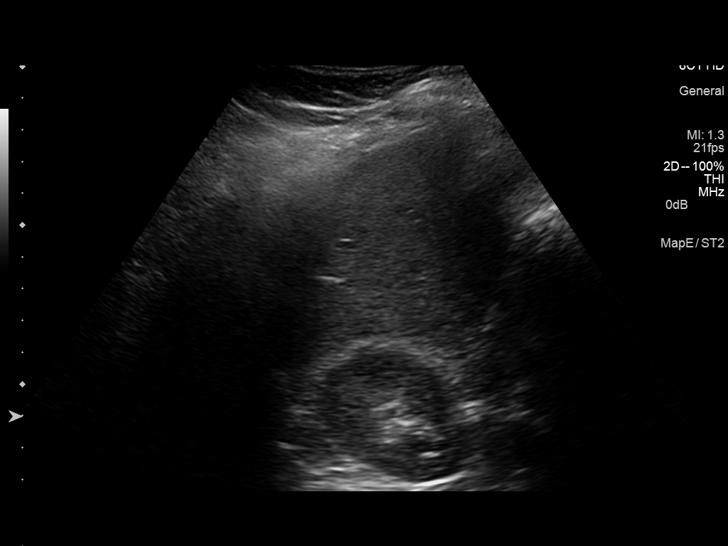
[im 8/24]
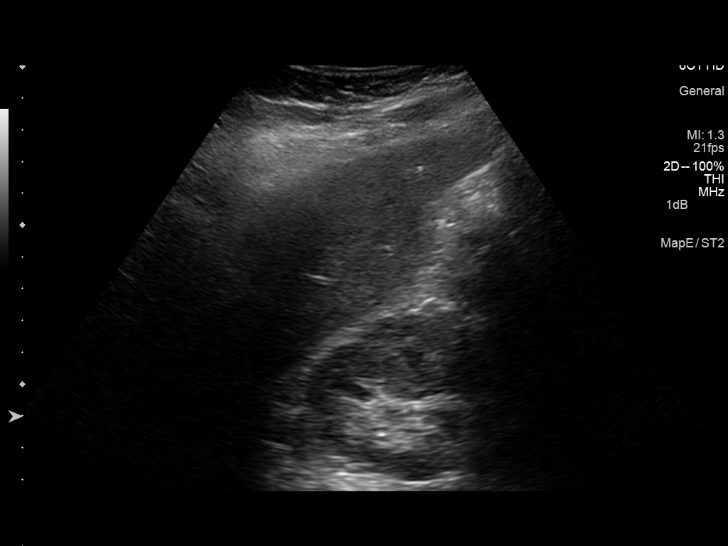
[im 10/24]
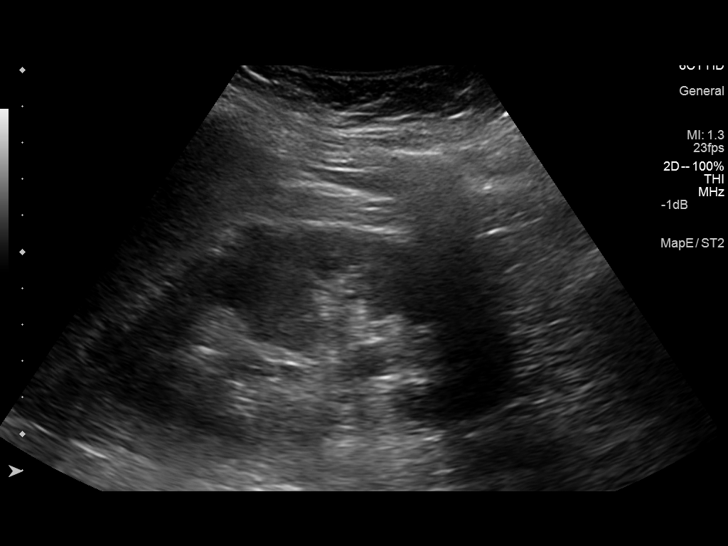
[im 12/24]
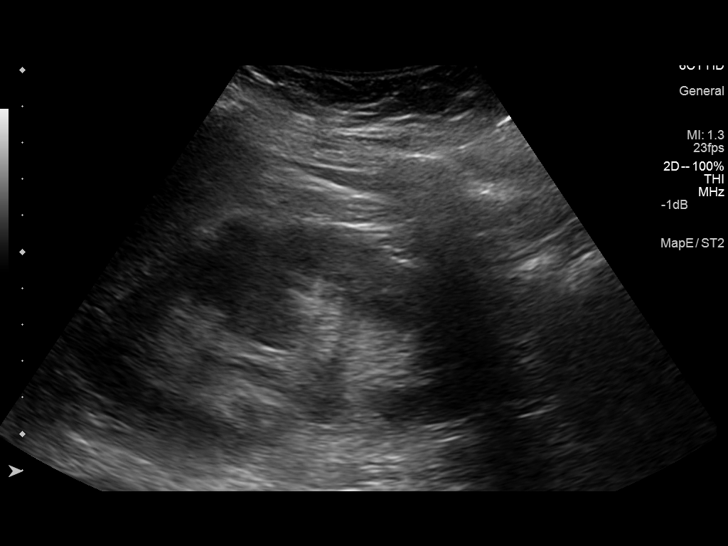
[im 13/24]
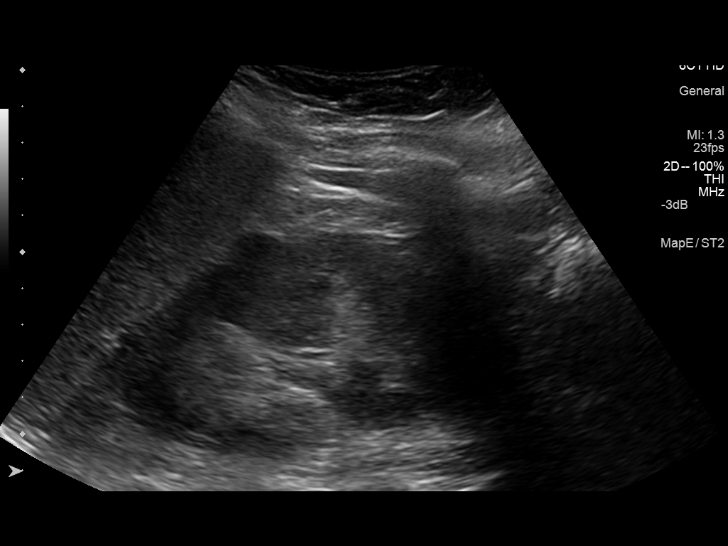
[im 15/24]
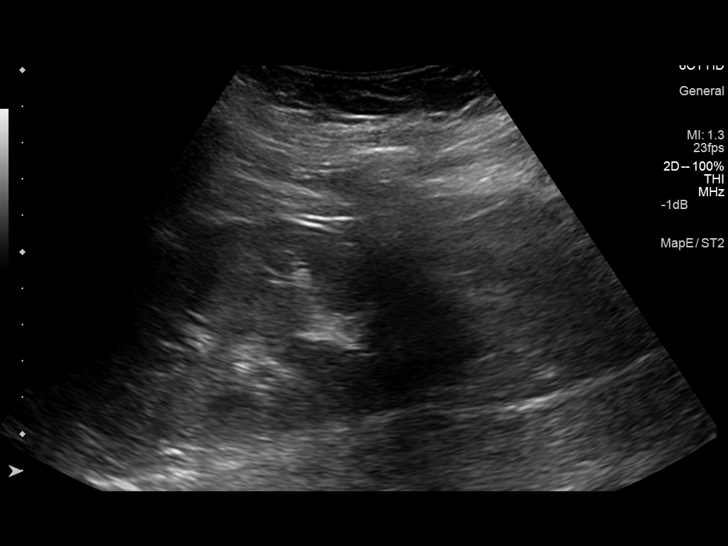
[im 17/24]
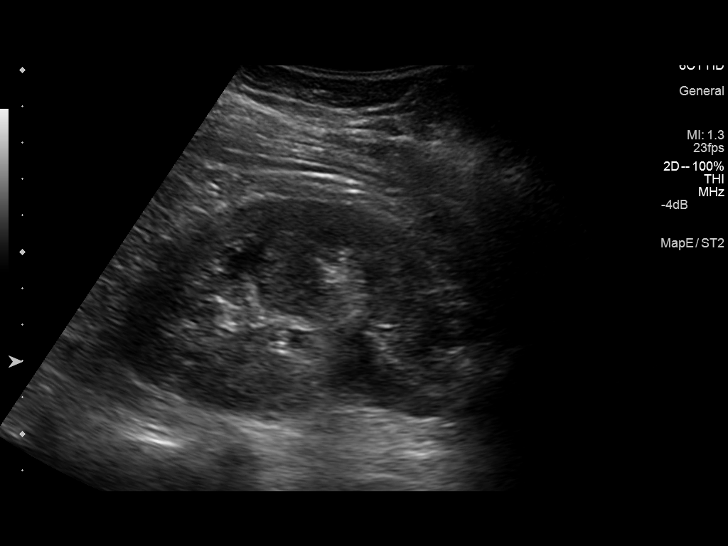
[im 19/24]
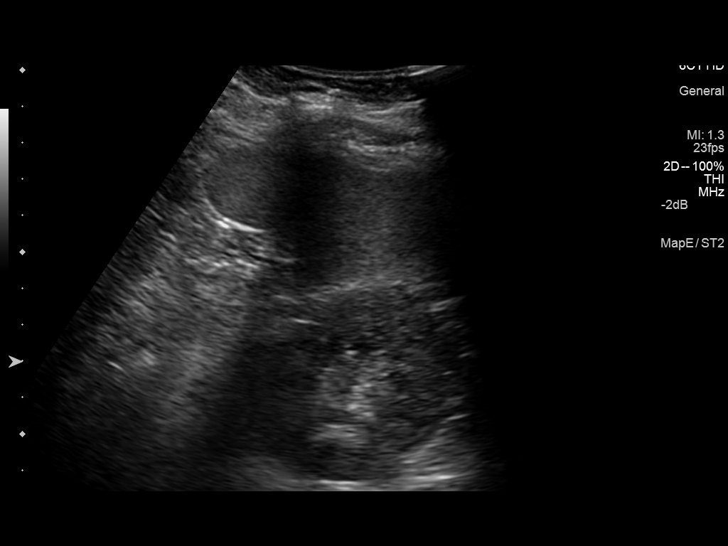
[im 20/24]
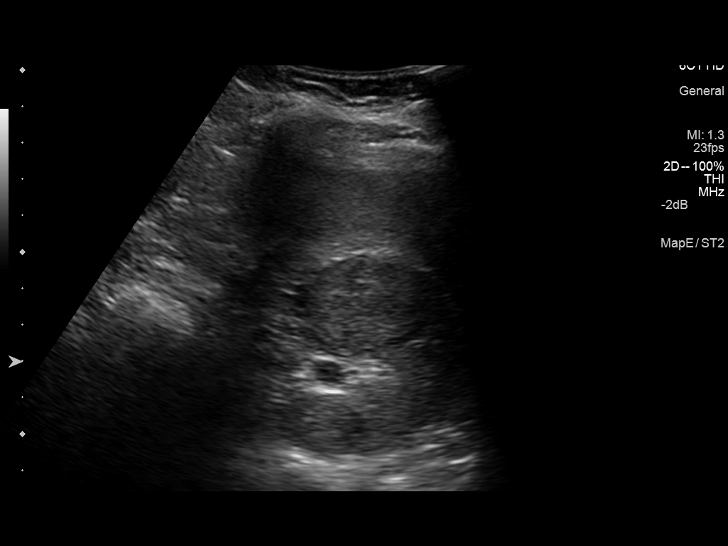
[im 22/24]
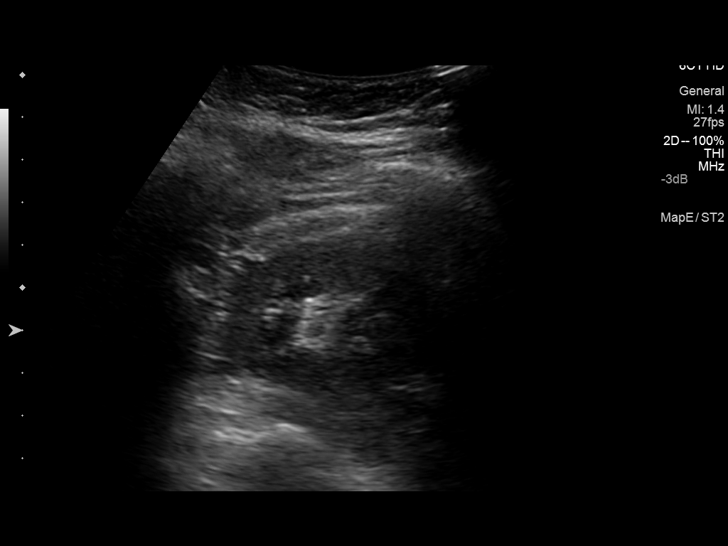
[im 24/24]
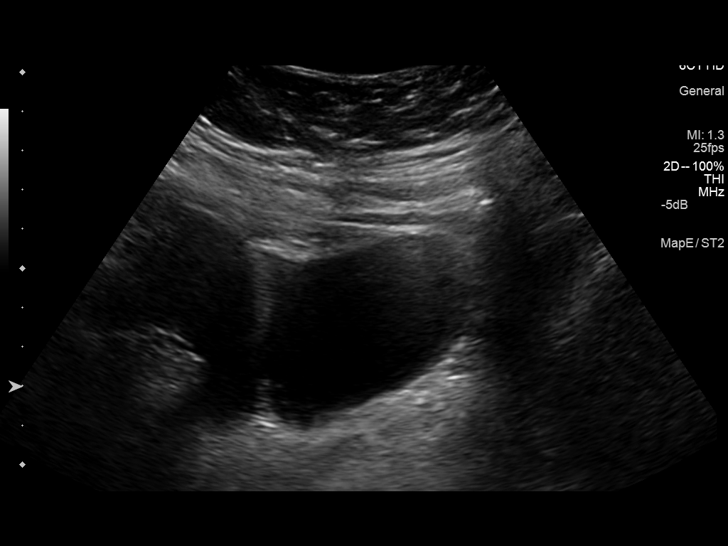

[14 of 24 positions shown; findings below may reference images not displayed]

FINDINGS: Right Kidney:

Length: 10.4 cm. Echogenicity within normal limits. No mass or
hydronephrosis visualized.

Left Kidney:

Length: 11.1 cm. Echogenicity within normal limits. No mass or
hydronephrosis visualized.

Bladder:

Appears normal for degree of bladder distention. No wall thickening
or filling defect identified appear
IMPRESSION: Normal ultrasound appearance of the kidneys and bladder.

## 2016-03-22 IMAGING — CR DG ABDOMEN 1V
2 series · 2 of 2 positions shown · non-contrast
Comparison: [HOSPITAL] [facility] 04/08/2014 and
earlier.

CLINICAL DATA: 43-year-old female with left nephrolithiasis and
flank pain. Initial encounter.

EXAM:
ABDOMEN - 1 VIEW

[t abdomen supine (1 of 2)]
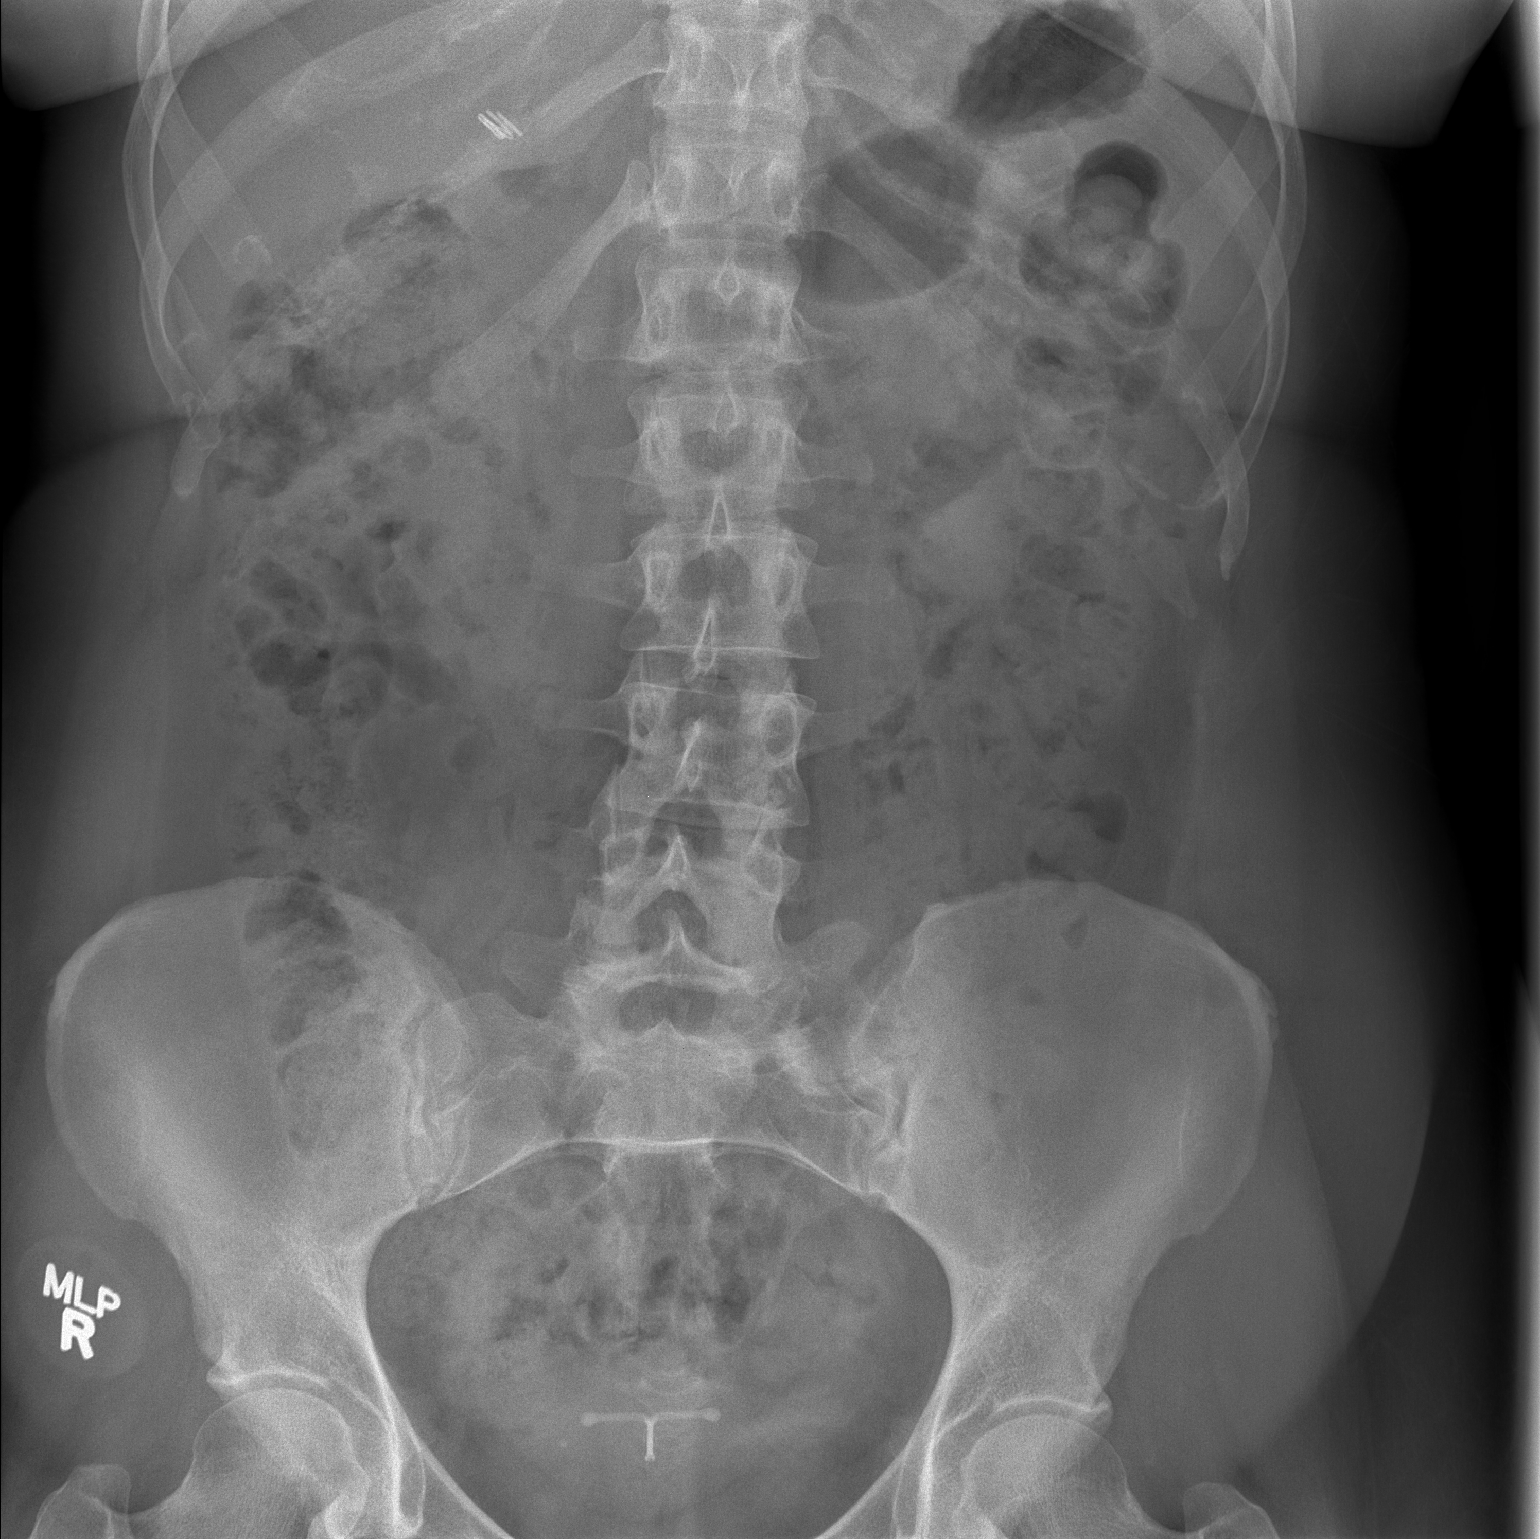

[t abdomen supine (2 of 2)]
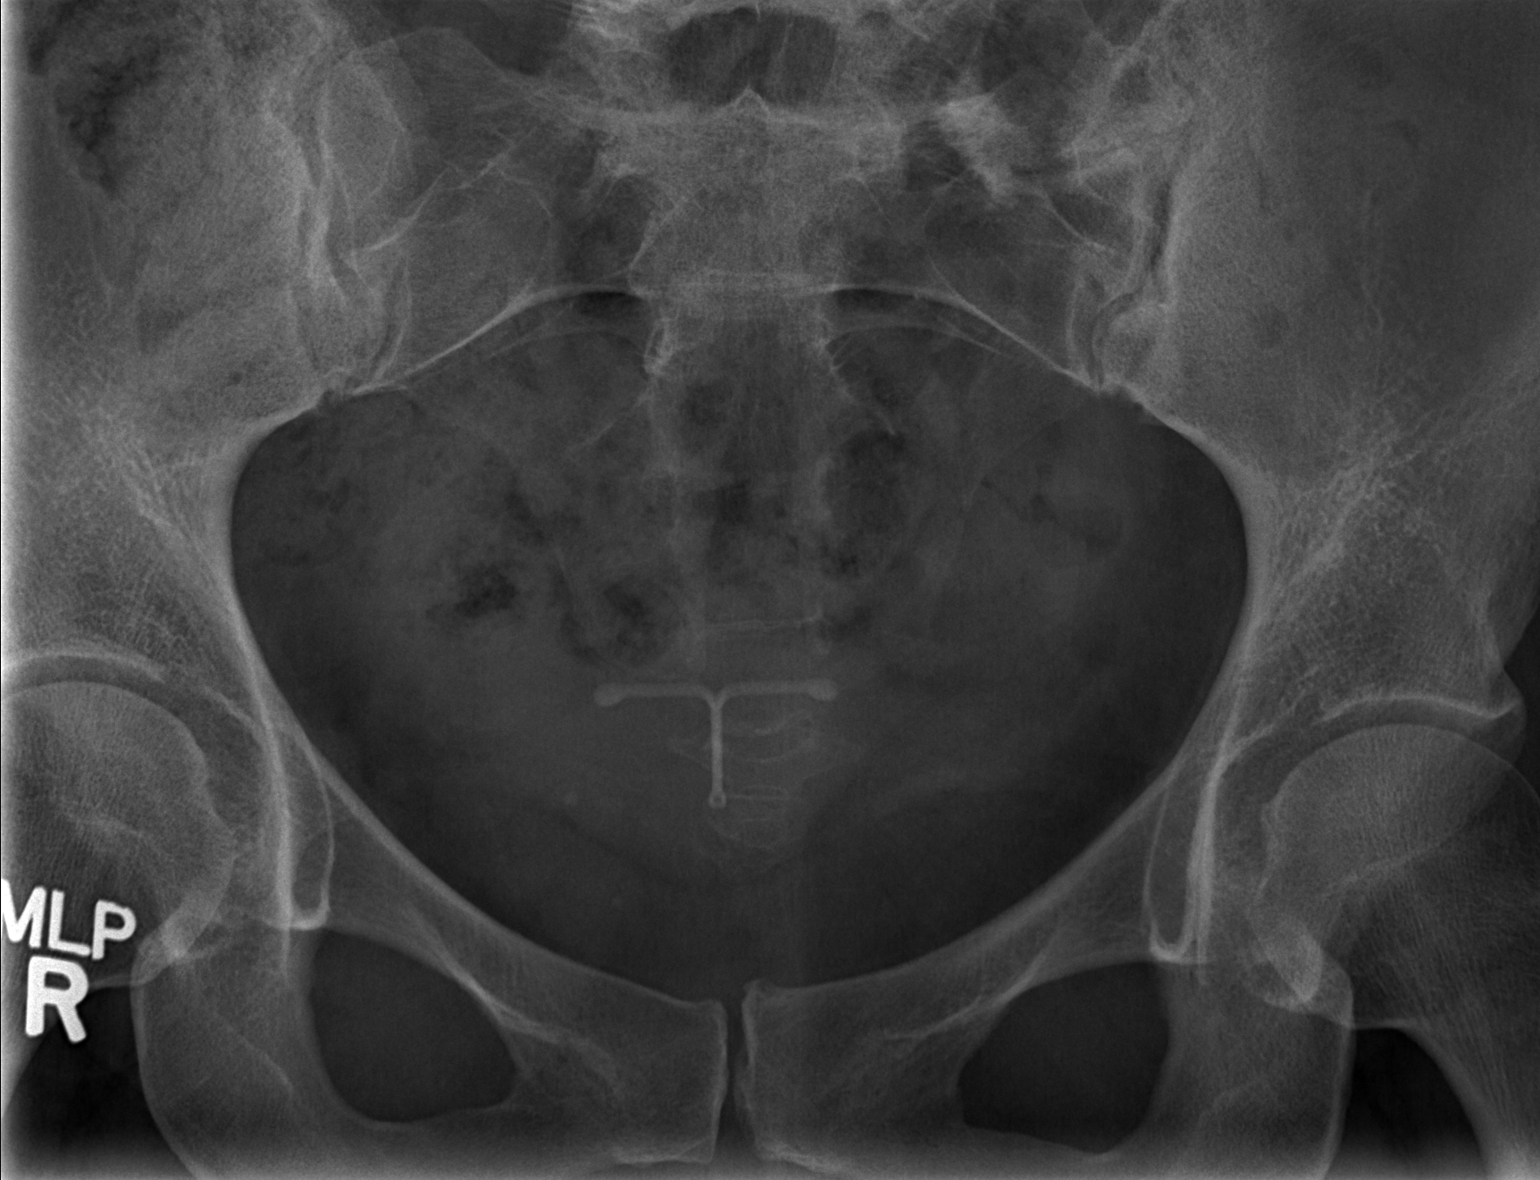

[2 of 2 positions shown; findings below may reference images not displayed]

FINDINGS: Stable cholecystectomy clips. IUD re- identified. Non obstructed
bowel gas pattern with retained stool throughout the colon.
Previously seen left hemipelvis angular calcification no longer
identified a much smaller chronic calculus to the right of midline
in the pelvis is more compatible with phlebolith. Also, a suspected
small calculus at the L3 transverse process level on 04/04/2014 is
no longer visible. No acute osseous abnormality identified.
IMPRESSION: No definite urologic calculus identified with plain radiographic
technique.

Interval clearance of a left hemipelvis calculus which could of been
in the distal left ureter or bladder.

## 2016-05-05 DIAGNOSIS — M25831 Other specified joint disorders, right wrist: Secondary | ICD-10-CM | POA: Insufficient documentation

## 2016-05-05 HISTORY — DX: Other specified joint disorders, right wrist: M25.831

## 2017-05-27 LAB — HM MAMMOGRAPHY

## 2017-06-01 LAB — HM PAP SMEAR: HM PAP: NORMAL

## 2018-02-15 ENCOUNTER — Ambulatory Visit: Payer: BC Managed Care – PPO | Admitting: Family Medicine

## 2018-02-15 ENCOUNTER — Encounter: Payer: Self-pay | Admitting: Family Medicine

## 2018-02-15 VITALS — BP 110/70 | HR 69 | Temp 98.2°F | Ht 63.75 in | Wt 199.0 lb

## 2018-02-15 DIAGNOSIS — J302 Other seasonal allergic rhinitis: Secondary | ICD-10-CM | POA: Diagnosis not present

## 2018-02-15 DIAGNOSIS — R5381 Other malaise: Secondary | ICD-10-CM

## 2018-02-15 DIAGNOSIS — E8881 Metabolic syndrome: Secondary | ICD-10-CM | POA: Diagnosis not present

## 2018-02-15 DIAGNOSIS — R5383 Other fatigue: Secondary | ICD-10-CM

## 2018-02-15 DIAGNOSIS — E88819 Insulin resistance, unspecified: Secondary | ICD-10-CM

## 2018-02-15 DIAGNOSIS — M25831 Other specified joint disorders, right wrist: Secondary | ICD-10-CM

## 2018-02-15 DIAGNOSIS — E559 Vitamin D deficiency, unspecified: Secondary | ICD-10-CM | POA: Diagnosis not present

## 2018-02-15 DIAGNOSIS — K219 Gastro-esophageal reflux disease without esophagitis: Secondary | ICD-10-CM

## 2018-02-15 DIAGNOSIS — Z13220 Encounter for screening for lipoid disorders: Secondary | ICD-10-CM | POA: Diagnosis not present

## 2018-02-15 DIAGNOSIS — F419 Anxiety disorder, unspecified: Secondary | ICD-10-CM

## 2018-02-15 DIAGNOSIS — Z975 Presence of (intrauterine) contraceptive device: Secondary | ICD-10-CM

## 2018-02-15 DIAGNOSIS — Z114 Encounter for screening for human immunodeficiency virus [HIV]: Secondary | ICD-10-CM

## 2018-02-15 DIAGNOSIS — E039 Hypothyroidism, unspecified: Secondary | ICD-10-CM

## 2018-02-15 DIAGNOSIS — K9041 Non-celiac gluten sensitivity: Secondary | ICD-10-CM

## 2018-02-15 DIAGNOSIS — R635 Abnormal weight gain: Secondary | ICD-10-CM

## 2018-02-15 DIAGNOSIS — J45991 Cough variant asthma: Secondary | ICD-10-CM | POA: Insufficient documentation

## 2018-02-15 LAB — LIPID PANEL
Cholesterol: 185 mg/dL (ref 0–200)
HDL: 48.7 mg/dL (ref >39.00)
LDL Cholesterol: 117 mg/dL — ABNORMAL HIGH (ref 0–99)
NonHDL: 136.47
Total CHOL/HDL Ratio: 4
Triglycerides: 95 mg/dL (ref 0.0–149.0)
VLDL: 19 mg/dL (ref 0.0–40.0)

## 2018-02-15 LAB — COMPREHENSIVE METABOLIC PANEL
ALT: 103 U/L — ABNORMAL HIGH (ref 0–35)
AST: 36 U/L (ref 0–37)
Albumin: 4.5 g/dL (ref 3.5–5.2)
Alkaline Phosphatase: 115 U/L (ref 39–117)
BUN: 12 mg/dL (ref 6–23)
CO2: 28 mEq/L (ref 19–32)
Calcium: 9.6 mg/dL (ref 8.4–10.5)
Chloride: 103 mEq/L (ref 96–112)
Creatinine, Ser: 0.63 mg/dL (ref 0.40–1.20)
GFR: 107.3 mL/min (ref >60.00)
Glucose, Bld: 87 mg/dL (ref 70–99)
Potassium: 4.1 mEq/L (ref 3.5–5.1)
Sodium: 139 mEq/L (ref 135–145)
Total Bilirubin: 0.5 mg/dL (ref 0.2–1.2)
Total Protein: 6.8 g/dL (ref 6.0–8.3)

## 2018-02-15 LAB — CBC WITH DIFFERENTIAL/PLATELET
Basophils Absolute: 0.1 10*3/uL (ref 0.0–0.1)
Basophils Relative: 0.7 % (ref 0.0–3.0)
Eosinophils Absolute: 0.2 10*3/uL (ref 0.0–0.7)
Eosinophils Relative: 1.8 % (ref 0.0–5.0)
HCT: 39.9 % (ref 36.0–46.0)
Hemoglobin: 13.4 g/dL (ref 12.0–15.0)
Lymphocytes Relative: 22.9 % (ref 12.0–46.0)
Lymphs Abs: 2 10*3/uL (ref 0.7–4.0)
MCHC: 33.6 g/dL (ref 30.0–36.0)
MCV: 89 fl (ref 78.0–100.0)
Monocytes Absolute: 0.5 10*3/uL (ref 0.1–1.0)
Monocytes Relative: 5.3 % (ref 3.0–12.0)
Neutro Abs: 6.1 10*3/uL (ref 1.4–7.7)
Neutrophils Relative %: 69.3 % (ref 43.0–77.0)
Platelets: 301 10*3/uL (ref 150.0–400.0)
RBC: 4.48 Mil/uL (ref 3.87–5.11)
RDW: 14.6 % (ref 11.5–15.5)
WBC: 8.9 10*3/uL (ref 4.0–10.5)

## 2018-02-15 LAB — HEMOGLOBIN A1C: Hgb A1c MFr Bld: 5.9 % (ref 4.6–6.5)

## 2018-02-15 LAB — T4, FREE: Free T4: 0.68 ng/dL (ref 0.60–1.60)

## 2018-02-15 LAB — TSH: TSH: 1.44 u[IU]/mL (ref 0.35–4.50)

## 2018-02-15 LAB — VITAMIN B12: Vitamin B-12: 847 pg/mL (ref 211–911)

## 2018-02-15 LAB — T3, FREE: T3, Free: 3.2 pg/mL (ref 2.3–4.2)

## 2018-02-15 LAB — VITAMIN D 25 HYDROXY (VIT D DEFICIENCY, FRACTURES): VITD: 34.19 ng/mL (ref 30.00–100.00)

## 2018-02-15 NOTE — Progress Notes (Signed)
Carmen Simon is a 47 y.o. female is here to Carmen Simon.   Patient Care Team: Carmen Deutscher, DO as PCP - General (Family Simon)   History of Present Illness:   HPI: Previously followed by Carmen Simon, Carmen Simon. Carmen Simon too far away. Feels that the "candida diet" and Liothyronine prescribed at that time saved her. 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 Perimenopausal. Goes to Carmen Simon. Hx of insulin resistane. Dad and sister with DM2. Hx of allergic reaction and one month of steroids that led to adrenal issues. Drinks Carmen Simon - stopped yesterday and will start drinking more water.   Health Maintenance Due  Topic Date Due  . TETANUS/TDAP  05/09/1989  . PAP SMEAR-Modifier  05/10/1991   Depression screen PHQ 2/9 02/15/2018  Decreased Interest 0  Down, Depressed, Hopeless 0  PHQ - 2 Score 0    PMHx, SurgHx, SocialHx, Medications, and Allergies were reviewed in the Visit Navigator and updated as appropriate.   Past Medical History:  Diagnosis Date  . Asthma, cough variant   . GERD (gastroesophageal reflux disease)    OCCASIONAL-- TAKE APPLE CIDER VINAGER  . Gluten intolerance    "causes retained water"  . History of chicken pox   . History of HPV infection   . History of palpitations    pvc'S  . Left ureteral calculus   . Nocturia   . PONV (postoperative nausea and vomiting)   . Seasonal allergies   . Thyroid disease    Hypothyroidism  . Wears glasses      Past Surgical History:  Procedure Laterality Date  . CESAREAN SECTION  12-28-2000  . D & C HYSTEROSOCPY W/ POLYPECTOMY  09/ 2000  . GYNECOLOGIC CRYOSURGERY  1992  . INTRAUTERINE DEVICE INSERTION  2008   MIRENA  . OSTEOTOMY AND ULNAR SHORTENING Right 2017  . TRANSTHORACIC ECHOCARDIOGRAM  10-27-2010   normal echo/  ef 65%  . ULNAR TUNNEL RELEASE Left 05/ 2014   LEFT ELBOW -- REVISION 11/ 2015    History reviewed. No  pertinent family history.  Social History   Tobacco Use  . Smoking status: Never Smoker  . Smokeless tobacco: Never Used  Substance Use Topics  . Alcohol use: Yes    Alcohol/week: 1.0 standard drinks    Types: 1 Glasses of wine per week  . Drug use: No    Current Medications and Allergies:   .  acetaminophen (TYLENOL) 325 MG tablet, Take 650 mg by mouth every 6 (six) hours as needed for moderate pain., Disp: , Rfl:  .  ALBUTEROL IN, Inhale 2 puffs into the lungs as needed. , Disp: , Rfl:  .  cetirizine (ZYRTEC) 10 MG tablet, Take 10 mg by mouth every morning. , Disp: , Rfl:  .  Cholecalciferol (VITAMIN D3) 2000 UNITS TABS, Take 1 tablet by mouth daily. , Disp: , Rfl:  .  Evening Primrose Oil 500 MG CAPS, Take 1 capsule by mouth daily. , Disp: , Rfl:  .  levonorgestrel (MIRENA) 20 MCG/24HR IUD, 1 each by Intrauterine route once. Sees GYN, IUD Expires in 2021, Disp: , Rfl:  .  montelukast (SINGULAIR) 10 MG tablet, Take 10 mg by mouth at bedtime., Disp: , Rfl:  .  OVER THE COUNTER MEDICATION, Take 1 capsule by mouth daily. 5HTP, Disp: , Rfl:  .  Probiotic Product (PROBIOTIC DAILY PO), Take 1 tablet by mouth daily. Cuterelle, Disp: , Rfl:  .  pyridOXINE (B-6) 50 MG tablet, Take by mouth., Disp: , Rfl:  .  vitamin B-12 (CYANOCOBALAMIN) 500 MCG tablet, Take 500 mcg by mouth daily., Disp: , Rfl:    Allergies  Allergen Reactions  . Aspirin Tinitus  . Gluten Meal Swelling    "retains water'  . Erythromycin Other (See Comments)    Stomach cramps   Review of Systems:   Pertinent items are noted in the HPI. Otherwise, a complete ROS is negative.  Vitals:   Vitals:   02/15/18 0920  BP: 110/70  Pulse: 69  Temp: 98.2 F (36.8 C)  TempSrc: Oral  SpO2: 95%  Weight: 199 lb (90.3 kg)  Height: 5' 3.75" (1.619 m)     Body mass index is 34.43 kg/m.  Physical Exam:   Physical Exam Vitals signs and nursing note reviewed.  HENT:     Head: Normocephalic and atraumatic.  Eyes:      Pupils: Pupils are equal, round, and reactive to light.  Neck:     Musculoskeletal: Normal range of motion and neck supple.  Cardiovascular:     Rate and Rhythm: Normal rate and regular rhythm.     Heart sounds: Normal heart sounds.  Pulmonary:     Effort: Pulmonary effort is normal.  Abdominal:     Palpations: Abdomen is soft.  Skin:    General: Skin is warm.  Psychiatric:        Behavior: Behavior normal.    Assessment and Plan:   Carmen Simon was seen today for establish care and hypothyroidism.  Diagnoses and all orders for this visit:  Acquired hypothyroidism -     TSH -     Thyroid Peroxidase Antibodies (TPO) (REFL) -     T4, free -     T3, free  IUD (Mirena) in place  Ulnar abutment syndrome of right wrist  Seasonal allergies  Gluten intolerance  Gastroesophageal reflux disease without esophagitis  Asthma, cough variant  Anxiety  Screening for HIV (human immunodeficiency virus) -     HIV Antibody (routine testing w rflx)  Vitamin D deficiency -     VITAMIN D 25 Hydroxy (Vit-D Deficiency, Fractures)  Weight gain  Malaise and fatigue -     CBC with Differential/Platelet -     Comprehensive metabolic panel -     Vitamin B12  Screening for lipid disorders -     Lipid panel  Insulin resistance -     Hemoglobin A1c   . Orders and follow up as documented in [place], reviewed diet, exercise and weight control, cardiovascular risk and specific lipid/LDL goals reviewed, reviewed medications and side effects in detail.  . Reviewed expectations re: course of current medical issues. . Outlined signs and symptoms indicating need for more acute intervention. . Patient verbalized understanding and all questions were answered. . Patient received an After Visit Summary.  Carmen Deutscher, DO [place] 02/26/2018  Records requested if needed. Time spent with the patient: 45 minutes, of which >50% was spent in obtaining information about her  symptoms, reviewing her previous labs, evaluations, and treatments, counseling her about her condition (please see the discussed topics above), and developing a plan to further investigate it; she had a number of questions which I addressed.

## 2018-02-15 NOTE — Patient Instructions (Signed)
Soda Facts  What's in a typical 20 oz bottle of soda?  1. Serving Size: A 20 oz soda bottle has 2.5 servings. It's important to look at the Nutrition Facts (right) for the package, not just a single serving, if drinking the entire bottle. A standard serving size is 8 oz.  2. Calories: There are 100 calories in one serving and 250 in the bottle. These calories have no nutritional value.  3. Sugars: It's easier to figure out how much sugar is in a drink by changing the grams to teaspoons. Divide the total sugar grams by four (4) to get an approximate number of teaspoons. This bottle contains about 17 teaspoons of sugar (694).  4. Ingredients: In this beverage, the main ingredient after water is high fructose corn syrup. Other common names for sugars are cane syrup, sucrose, and dextrose.  Sugary Drinks and Your Health  Drinking a 20-ounce (oz) bottle of soda every day can lead to about 25 extra pounds of weight gain a year.   To burn off the additional 250 calories in a 20 oz bottle of soda, you would have to walk briskly for approximately 60 minutes. Choosing healthier drinks is an essential step in maintaining a healthy weight and helping to reverse the obesity epidemic.

## 2018-02-16 LAB — HIV ANTIBODY (ROUTINE TESTING W REFLEX): HIV 1&2 Ab, 4th Generation: NONREACTIVE

## 2018-02-16 LAB — THYROID PEROXIDASE ANTIBODIES (TPO) (REFL): Thyroperoxidase Ab SerPl-aCnc: 1 IU/mL (ref <9)

## 2018-02-26 ENCOUNTER — Encounter: Payer: Self-pay | Admitting: Family Medicine

## 2018-03-12 NOTE — Progress Notes (Signed)
Carmen Simon is a 48 y.o. female is here for follow up.  History of Present Illness:   Carmen Simon, CMA acting as scribe for Dr. Briscoe Deutscher.   HPI: Patient in office for follow up on labs. See Assessment and Plan section for Problem Based Charting of issues discussed today.   Health Maintenance Due  Topic Date Due  . TETANUS/TDAP  05/09/1989  . PAP SMEAR-Modifier  05/10/1991   Depression screen PHQ 2/9 02/15/2018  Decreased Interest 0  Down, Depressed, Hopeless 0  PHQ - 2 Score 0   PMHx, SurgHx, SocialHx, FamHx, Medications, and Allergies were reviewed in the Visit Navigator and updated as appropriate.   Patient Active Problem List   Diagnosis Date Noted  . Anxiety 02/15/2018  . Seasonal allergies   . Gluten intolerance   . GERD (gastroesophageal reflux disease)   . Asthma, cough variant   . 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 Cubital tunnel syndrome on right 06/29/2012  . TFCC (triangular fibrocartilage complex) tear 06/29/2012   Social History   Tobacco Use  . Smoking status: Never Smoker  . Smokeless tobacco: Never Used  Substance Use Topics  . Alcohol use: Yes    Alcohol/week: 1.0 standard drinks    Types: 1 Glasses of wine per week  . Drug use: No   Current Medications and Allergies:   .  acetaminophen (TYLENOL) 325 MG tablet, Take 650 mg by mouth every 6 (six) hours as needed for moderate pain., Disp: , Rfl:  .  ALBUTEROL IN, Inhale 2 puffs into the lungs as needed. , Disp: , Rfl:  .  cetirizine (ZYRTEC) 10 MG tablet, Take 10 mg by mouth every morning. , Disp: , Rfl:  .  Cholecalciferol (VITAMIN D3) 2000 UNITS TABS, Take 1 tablet by mouth daily. , Disp: , Rfl:  .  Evening Primrose Oil 500 MG CAPS, Take 1 capsule by mouth daily. , Disp: , Rfl:  .  levonorgestrel (MIRENA) 20 MCG/24HR IUD, 1 each by Intrauterine route once. Sees GYN, IUD Expires in 2021, Disp: , Rfl:  .  montelukast (SINGULAIR) 10 MG tablet, Take 10 mg by mouth at  bedtime., Disp: , Rfl:  .  OVER THE COUNTER MEDICATION, Take 1 capsule by mouth daily. 5HTP, Disp: , Rfl:  .  Probiotic Product (PROBIOTIC DAILY PO), Take 1 tablet by mouth daily. Cuterelle, Disp: , Rfl:  .  pyridOXINE (B-6) 50 MG tablet, Take by mouth., Disp: , Rfl:  .  vitamin B-12 (CYANOCOBALAMIN) 500 MCG tablet, Take 500 mcg by mouth daily., Disp: , Rfl:    Allergies  Allergen Reactions  . Aspirin Tinitus  . Gluten Meal Swelling    "retains water'  . Erythromycin Other (See Comments)    Stomach cramps   Review of Systems   Pertinent items are noted in the HPI. Otherwise, a complete ROS is negative.  Vitals:   Vitals:   03/13/18 1451  BP: 108/68  Pulse: 100  Temp: 98.6 F (37 C)  TempSrc: Oral  SpO2: 94%  Weight: 202 lb 9.6 oz (91.9 kg)  Height: 5\' 4"  (1.626 m)     Body mass index is 34.78 kg/m.  Physical Exam:   Physical Exam Vitals signs and nursing note reviewed.  HENT:     Head: Normocephalic and atraumatic.  Eyes:     Pupils: Pupils are equal, round, and reactive to light.  Neck:     Musculoskeletal: Normal range of motion and neck supple.  Cardiovascular:     Rate and Rhythm: Normal rate and regular rhythm.     Heart sounds: Normal heart sounds.  Pulmonary:     Effort: Pulmonary effort is normal.  Abdominal:     Palpations: Abdomen is soft.  Skin:    General: Skin is warm.  Psychiatric:        Behavior: Behavior normal.    Results for orders placed or performed in visit on 02/15/18  HIV Antibody (routine testing w rflx)  Result Value Ref Range   HIV 1&2 Ab, 4th Generation NON-REACTIVE NON-REACTI  CBC with Differential/Platelet  Result Value Ref Range   WBC 8.9 4.0 - 10.5 K/uL   RBC 4.48 3.87 - 5.11 Mil/uL   Hemoglobin 13.4 12.0 - 15.0 g/dL   HCT 39.9 36.0 - 46.0 %   MCV 89.0 78.0 - 100.0 fl   MCHC 33.6 30.0 - 36.0 g/dL   RDW 14.6 11.5 - 15.5 %   Platelets 301.0 150.0 - 400.0 K/uL   Neutrophils Relative % 69.3 43.0 - 77.0 %    Lymphocytes Relative 22.9 12.0 - 46.0 %   Monocytes Relative 5.3 3.0 - 12.0 %   Eosinophils Relative 1.8 0.0 - 5.0 %   Basophils Relative 0.7 0.0 - 3.0 %   Neutro Abs 6.1 1.4 - 7.7 K/uL   Lymphs Abs 2.0 0.7 - 4.0 K/uL   Monocytes Absolute 0.5 0.1 - 1.0 K/uL   Eosinophils Absolute 0.2 0.0 - 0.7 K/uL   Basophils Absolute 0.1 0.0 - 0.1 K/uL  Comprehensive metabolic panel  Result Value Ref Range   Sodium 139 135 - 145 mEq/L   Potassium 4.1 3.5 - 5.1 mEq/L   Chloride 103 96 - 112 mEq/L   CO2 28 19 - 32 mEq/L   Glucose, Bld 87 70 - 99 mg/dL   BUN 12 6 - 23 mg/dL   Creatinine, Ser 0.63 0.40 - 1.20 mg/dL   Total Bilirubin 0.5 0.2 - 1.2 mg/dL   Alkaline Phosphatase 115 39 - 117 U/L   AST 36 0 - 37 U/L   ALT 103 (H) 0 - 35 U/L   Total Protein 6.8 6.0 - 8.3 g/dL   Albumin 4.5 3.5 - 5.2 g/dL   Calcium 9.6 8.4 - 10.5 mg/dL   GFR 107.30 >60.00 mL/min  Hemoglobin A1c  Result Value Ref Range   Hgb A1c MFr Bld 5.9 4.6 - 6.5 %  Lipid panel  Result Value Ref Range   Cholesterol 185 0 - 200 mg/dL   Triglycerides 95.0 0.0 - 149.0 mg/dL   HDL 48.70 >39.00 mg/dL   VLDL 19.0 0.0 - 40.0 mg/dL   LDL Cholesterol 117 (H) 0 - 99 mg/dL   Total CHOL/HDL Ratio 4    NonHDL 136.47   TSH  Result Value Ref Range   TSH 1.44 0.35 - 4.50 uIU/mL  Thyroid Peroxidase Antibodies (TPO) (REFL)  Result Value Ref Range   Thyroperoxidase Ab SerPl-aCnc 1 <9 IU/mL  T4, free  Result Value Ref Range   Free T4 0.68 0.60 - 1.60 ng/dL  Vitamin B12  Result Value Ref Range   Vitamin B-12 847 211 - 911 pg/mL  VITAMIN D 25 Hydroxy (Vit-D Deficiency, Fractures)  Result Value Ref Range   VITD 34.19 30.00 - 100.00 ng/mL  T3, free  Result Value Ref Range   T3, Free 3.2 2.3 - 4.2 pg/mL    Assessment and Plan:   [name] was seen today for follow-up.  Diagnoses and all orders  for this visit:  Elevated LFTs Comments: Discussed Hx. Decided to go ahead and image.  Orders: -     US ABDOMEN LIMITED RUQ;  Future  Insulin resistance Comments: Discussed diet changes needed to manage this. She prefers no medications.    . Orders and follow up as documented in [place], reviewed diet, exercise and weight control, cardiovascular risk and specific lipid/LDL goals reviewed, reviewed medications and side effects in detail.  . Reviewed expectations re: course of current medical issues. . Outlined signs and symptoms indicating need for more acute intervention. . Patient verbalized understanding and all questions were answered. . Patient received an After Visit Summary.  CMA served as Education administrator during this visit. History, Physical, and Plan performed by medical provider. The above documentation has been reviewed and is accurate and complete. Briscoe Deutscher, D.O.  Briscoe Deutscher, DO [place] 03/19/2018

## 2018-03-13 ENCOUNTER — Ambulatory Visit: Payer: BC Managed Care – PPO | Admitting: Family Medicine

## 2018-03-13 VITALS — BP 108/68 | HR 100 | Temp 98.6°F | Ht 64.0 in | Wt 202.6 lb

## 2018-03-13 DIAGNOSIS — R7989 Other specified abnormal findings of blood chemistry: Secondary | ICD-10-CM

## 2018-03-13 DIAGNOSIS — E88819 Insulin resistance, unspecified: Secondary | ICD-10-CM

## 2018-03-13 DIAGNOSIS — R945 Abnormal results of liver function studies: Secondary | ICD-10-CM | POA: Diagnosis not present

## 2018-03-13 DIAGNOSIS — E8881 Metabolic syndrome: Secondary | ICD-10-CM

## 2018-03-16 ENCOUNTER — Ambulatory Visit
Admission: RE | Admit: 2018-03-16 | Discharge: 2018-03-16 | Disposition: A | Discharge status: Home / Self Care | Payer: BC Managed Care – PPO | Source: Ambulatory Visit | Attending: Family Medicine | Admitting: Family Medicine

## 2018-03-16 DIAGNOSIS — R7989 Other specified abnormal findings of blood chemistry: Secondary | ICD-10-CM

## 2018-03-16 DIAGNOSIS — R945 Abnormal results of liver function studies: Principal | ICD-10-CM

## 2018-03-19 ENCOUNTER — Encounter: Payer: Self-pay | Admitting: Family Medicine

## 2018-03-24 ENCOUNTER — Encounter: Payer: Self-pay | Admitting: Physical Therapy

## 2018-05-17 ENCOUNTER — Ambulatory Visit: Payer: BC Managed Care – PPO | Admitting: Family Medicine

## 2018-05-17 ENCOUNTER — Encounter: Payer: Self-pay | Admitting: Family Medicine

## 2018-05-17 ENCOUNTER — Other Ambulatory Visit: Payer: Self-pay

## 2018-05-17 VITALS — BP 120/78 | HR 76 | Temp 98.5°F | Ht 64.0 in | Wt 196.0 lb

## 2018-05-17 DIAGNOSIS — Z23 Encounter for immunization: Secondary | ICD-10-CM | POA: Diagnosis not present

## 2018-05-17 DIAGNOSIS — R945 Abnormal results of liver function studies: Secondary | ICD-10-CM | POA: Diagnosis not present

## 2018-05-17 DIAGNOSIS — E88819 Insulin resistance, unspecified: Secondary | ICD-10-CM

## 2018-05-17 DIAGNOSIS — R600 Localized edema: Secondary | ICD-10-CM | POA: Diagnosis not present

## 2018-05-17 DIAGNOSIS — E8881 Metabolic syndrome: Secondary | ICD-10-CM | POA: Diagnosis not present

## 2018-05-17 DIAGNOSIS — R7989 Other specified abnormal findings of blood chemistry: Secondary | ICD-10-CM

## 2018-05-17 LAB — COMPREHENSIVE METABOLIC PANEL
ALT: 27 U/L (ref 0–35)
AST: 19 U/L (ref 0–37)
Albumin: 4.3 g/dL (ref 3.5–5.2)
Alkaline Phosphatase: 79 U/L (ref 39–117)
BUN: 12 mg/dL (ref 6–23)
CO2: 25 mEq/L (ref 19–32)
Calcium: 9 mg/dL (ref 8.4–10.5)
Chloride: 105 mEq/L (ref 96–112)
Creatinine, Ser: 0.56 mg/dL (ref 0.40–1.20)
GFR: 115.53 mL/min (ref >60.00)
Glucose, Bld: 99 mg/dL (ref 70–99)
Potassium: 4.2 mEq/L (ref 3.5–5.1)
Sodium: 138 mEq/L (ref 135–145)
Total Bilirubin: 0.4 mg/dL (ref 0.2–1.2)
Total Protein: 6.4 g/dL (ref 6.0–8.3)

## 2018-05-17 LAB — HEMOGLOBIN A1C: Hgb A1c MFr Bld: 5.5 % (ref 4.6–6.5)

## 2018-05-17 MED ORDER — HYDROCHLOROTHIAZIDE 12.5 MG PO CAPS: 12.5000 mg | ORAL_CAPSULE | Freq: Every day | ORAL | 2 refills | Status: DC

## 2018-05-17 NOTE — Patient Instructions (Addendum)
Try [employer], on [employer]     ...Marland KitchenMarland KitchenVaccine Information Statement   Tdap (Tetanus, Diphtheria, Pertussis) Vaccine: What You Need to Know  Many Vaccine Information Statements are available in Spanish and other languages. See AbsolutelyGenuine.com.br. Hojas de Informacin Sobre Vacunas estn disponibles en espaol y en muchos otros idiomas. Visite https://www.martin.org/  1. Why get vaccinated?  Tetanus, diphtheria, and pertussis are very serious diseases. Tdap vaccine can protect [place] from these diseases.  And, Tdap vaccine given to pregnant women can protect newborn babies against pertussis.  TETANUS (Lockjaw) is rare in the [place] today. It causes painful muscle tightening and stiffness, usually all over the body. . It can lead to tightening of muscles in the head and neck so you can't open your mouth, swallow, or sometimes even breathe. Tetanus kills about 1 out of 10 people who are infected even after receiving the best medical care.    DIPHTHERIA is also rare in the [place] today.  It can cause a thick coating to form in the back of the throat. . It can lead to breathing problems, heart failure, paralysis, and death.  PERTUSSIS (Whooping Cough) causes severe coughing spells, which can cause difficulty breathing, vomiting, and disturbed sleep. . It can also lead to weight loss, incontinence, and rib fractures. Up to 2 in 100 adolescents and 5 in 100 adults with pertussis are hospitalized or have complications, which could include pneumonia or death.   These diseases are caused by bacteria. Diphtheria and pertussis are spread from person to person through secretions from coughing or sneezing. Tetanus enters the body through cuts, scratches, or wounds.  Before vaccines, as many as 200,000 cases of diphtheria, 200,000 cases of pertussis, and hundreds of cases of tetanus, were reported in the [place] each year. Since vaccination began, reports of cases for  tetanus and diphtheria have dropped by about 99% and for pertussis by about 80%.  2. Tdap vaccine  Tdap vaccine can protect adolescents and adults from tetanus, diphtheria, and pertussis. One dose of Tdap is routinely given at age 59 or 10.  People who did not get Tdap at that age should get it as soon as possible.  Tdap is especially important for health care professionals and anyone having close contact with a baby younger than 12 months.    Pregnant women should get a dose of Tdap during every pregnancy, to protect the newborn from pertussis.  Infants are most at risk for severe, life-threatening complications from pertussis.  Another vaccine, called Td, protects against tetanus and diphtheria, but not pertussis. A Td booster should be given every 10 years. Tdap may be given as one of these boosters if you have never gotten Tdap before.  Tdap may also be given after a severe cut or burn to prevent tetanus infection.  Your doctor or the person giving you the vaccine can give you more information.  Tdap may safely be given at the same time as other vaccines.  3. Some people should not get this vaccine  ; A person who has ever had a life-threatening allergic reaction after a previous dose of any diphtheria, tetanus or pertussis containing vaccine, OR has a severe allergy to any part of this vaccine, should not get Tdap vaccine.  Tell the person giving the vaccine about any severe allergies.  ; Anyone who had coma or long repeated seizures within 7 days after a childhood dose of DTP or DTaP, or a previous dose of Tdap, should not  get Tdap, unless a cause other than the vaccine was found.  They can still get Td.  ; Talk to your doctor if you: - have seizures or another nervous system problem, - had severe pain or swelling after any vaccine containing diphtheria, tetanus or pertussis,  - ever had a condition called Guillain Barr Syndrome (GBS), - aren't feeling well on the day the shot is  scheduled.  4. Risks  With any medicine, including vaccines, there is a chance of side effects. These are usually mild and go away on their own. Serious reactions are also possible but are rare.   Most people who get Tdap vaccine do not have any problems with it.  Mild Problems following Tdap (Did not interfere with activities) ; Pain where the shot was given (about 3 in 4 adolescents or 2 in 3 adults) ; Redness or swelling where the shot was given (about 1 person in 5) ; Mild fever of at least 100.79F (up to about 1 in 25 adolescents or 1 in 100 adults) ; Headache (about 3 or 4 people in 10) ; Tiredness (about 1 person in 3 or 4) ; Nausea, vomiting, diarrhea, stomach ache (up to 1 in 4 adolescents or 1 in 10 adults) ; Chills,  sore joints (about 1 person in 10) ; Body aches (about 1 person in 3 or 4)  ; Rash, swollen glands (uncommon)  Moderate Problems following Tdap (Interfered with activities, but did not require medical attention) ; Pain where the shot was given (up to 1 in 5 or 6)  ; Redness or swelling where the shot was given (up to about 1 in 16 adolescents or 1 in 12 adults) ; Fever over 102F (about 1 in 100 adolescents or 1 in 250 adults) ; Headache (about 1 in 7 adolescents or 1 in 10 adults) ; Nausea, vomiting, diarrhea, stomach ache (up to 1 or 3 people in 100) ; Swelling of the entire arm where the shot was given (up to about 1 in 500).   Severe Problems following Tdap (Unable to perform usual activities; required medical attention) ; Swelling, severe pain, bleeding, and redness in the arm where the shot was given (rare).  Problems that could happen after any vaccine:  ; People sometimes faint after a medical procedure, including vaccination. Sitting or lying down for about 15 minutes can help prevent fainting, and injuries caused by a fall. Tell your doctor if you feel dizzy, or have vision changes or ringing in the ears.  ; Some people get severe pain in the  shoulder and have difficulty moving the arm where a shot was given. This happens very rarely.  ; Any medication can cause a severe allergic reaction. Such reactions from a vaccine are very rare, estimated at fewer than 1 in a million doses, and would happen within a few minutes to a few hours after the vaccination.   As with any medicine, there is a very remote chance of a vaccine causing a serious injury or death.  The safety of vaccines is always being monitored. For more information, visit: http://www.aguilar.org/  5. What if there is a serious problem?  What should I look for? ; Look for anything that concerns you, such as signs of a severe allergic reaction, very high fever, or unusual behavior.  ; Signs of a severe allergic reaction can include hives, swelling of the face and throat, difficulty breathing, a fast heartbeat, dizziness, and weakness. These would usually start a few minutes  to a few hours after the vaccination.  What should I do? ; If you think it is a severe allergic reaction or other emergency that can't wait, call 9-1-1 or get the person to the nearest hospital. Otherwise, call your doctor.  ; Afterward, the reaction should be reported to the Vaccine Adverse Event Reporting System (VAERS). Your doctor might file this report, or you can do it yourself through the VAERS web site at www.vaers.SamedayNews.es, or by calling 2042297367.  VAERS does not give medical advice.  6. The National Vaccine Injury Compensation Program  The [employer] Vaccine Injury Compensation Program (VICP) is a federal program that was created to compensate people who may have been injured by certain vaccines.  Persons who believe they may have been injured by a vaccine can learn about the program and about filing a claim by calling (580)851-2692 or visiting the [employer] website at GoldCloset.com.ee. There is a time limit to file a claim for compensation.   7. How can I learn  more?  ; Ask your doctor. He or she can give you the vaccine package insert or suggest other sources of information. ; Call your local or state health department. ; Contact the Centers for Disease Control and Prevention (CDC): - Call 580-454-1226 (1-800-CDC-INFO) or - Visit CDC's website at http://hunter.com/   Vaccine Information Statement  Tdap Vaccine (05/01/2013) 42 U.S.C.  [id number]  Department of Health and Geneticist, molecular for Disease Control and Prevention  Office Use Only

## 2018-05-17 NOTE — Progress Notes (Signed)
Carmen Simon is a 48 y.o. female is here for follow up.  Assessment and Plan:   Carmen Simon was seen today for follow-up.  Diagnoses and all orders for this visit:  Insulin resistance Comments: Improved significantly.  I congratulated her on her efforts. Orders: -     Hemoglobin A1c  Need for Tdap vaccination -     Tdap vaccine greater than or equal to 7yo IM  Elevated LFTs Comments: Resolved.  Normal ultrasound. Orders: -     Comprehensive metabolic panel  Bilateral lower extremity edema Comments: Mild and intermittent.  Okay HCTZ for as needed use.  Orders: -     hydrochlorothiazide (MICROZIDE) 12.5 MG capsule; Take 1 capsule (12.5 mg total) by mouth daily.   Subjective:   HPI:   Wt Readings from Last 3 Encounters:  05/17/18 196 lb (88.9 kg)  03/13/18 202 lb 9.6 oz (91.9 kg)  02/15/18 199 lb (90.3 kg)   Patient has been doing well since her last visit.  She has been working on making healthy food choices and exercising more often.  She is trying to avoid any medications for her metabolic conditions.  She is disappointed in her weight loss number, hoping that she has lost more.  I am excited to see how her labs have improved.  Health Maintenance:   There are no preventive care reminders to display for this patient. Depression screen Carmen Simon 2/9 05/17/2018 02/15/2018  Decreased Interest 0 0  Down, Depressed, Hopeless 0 0  PHQ - 2 Score 0 0  Altered sleeping 1 -  Tired, decreased energy 2 -  Change in appetite 0 -  Feeling bad or failure about yourself  0 -  Trouble concentrating 1 -  Moving slowly or fidgety/restless 0 -  Suicidal thoughts 0 -  PHQ-9 Score 4 -  Difficult doing work/chores Not difficult at all -   PMHx, SurgHx, SocialHx, FamHx, Medications, and Allergies were reviewed in the Visit Navigator and updated as appropriate.   Patient Active Problem List   Diagnosis Date Noted  . Insulin resistance 05/21/2018  . Elevated LFTs 05/21/2018  .  Bilateral lower extremity edema 05/21/2018  . Anxiety 02/15/2018  . Seasonal allergies   . Gluten intolerance   . GERD (gastroesophageal reflux disease)   . Asthma, cough variant   . 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 Cubital tunnel syndrome on right 06/29/2012  . TFCC (triangular fibrocartilage complex) tear 06/29/2012   Social History   Tobacco Use  . Smoking status: Never Smoker  . Smokeless tobacco: Never Used  Substance Use Topics  . Alcohol use: Yes    Alcohol/week: 1.0 standard drinks    Types: 1 Glasses of wine per week  . Drug use: No   Current Medications and Allergies:   .  acetaminophen (TYLENOL) 325 MG tablet, Take 650 mg by mouth every 6 (six) hours as needed for moderate pain., Disp: , Rfl:  .  ALBUTEROL IN, Inhale 2 puffs into the lungs as needed. , Disp: , Rfl:  .  cetirizine (ZYRTEC) 10 MG tablet, Take 10 mg by mouth every morning. , Disp: , Rfl:  .  Cholecalciferol (VITAMIN D3) 2000 UNITS TABS, Take 1 tablet by mouth daily. , Disp: , Rfl:  .  Evening Primrose Oil 500 MG CAPS, Take 1 capsule by mouth daily. , Disp: , Rfl:  .  levonorgestrel (MIRENA) 20 MCG/24HR IUD, 1 each by Intrauterine route once. Sees GYN, IUD Expires in 2021,  Disp: , Rfl:  .  montelukast (SINGULAIR) 10 MG tablet, Take 10 mg by mouth at bedtime., Disp: , Rfl:  .  OVER THE COUNTER MEDICATION, Take 1 capsule by mouth daily. 5HTP, Disp: , Rfl:  .  Probiotic Product (PROBIOTIC DAILY PO), Take 1 tablet by mouth daily. Cuterelle, Disp: , Rfl:  .  pyridOXINE (B-6) 50 MG tablet, Take by mouth., Disp: , Rfl:  .  vitamin B-12 (CYANOCOBALAMIN) 500 MCG tablet, Take 500 mcg by mouth daily., Disp: , Rfl:    Allergies  Allergen Reactions  . Aspirin Tinitus  . Gluten Meal Swelling    "retains water'  . Erythromycin Other (See Comments)    Stomach cramps   Review of Systems   Pertinent items are noted in the HPI. Otherwise, ROS is negative.  Vitals:   Vitals:   05/17/18 0744    BP: 120/78  Pulse: 76  Temp: 98.5 F (36.9 C)  TempSrc: Oral  SpO2: 98%  Weight: 196 lb (88.9 kg)  Height: 5\' 4"  (1.626 m)     Body mass index is 33.64 kg/m. Physical Exam:   General: Cooperative, alert and oriented, well developed, well nourished, in no acute distress. HEENT: Pupils equal round reactive light and extraocular movements intact. Conjunctivae and lids unremarkable. No pallor or cyanosis, dentition good. Neck: No thyromegaly.  Cardiovascular: Regular rhythm. No murmurs appreciated.  Lungs: Normal work of breathing. Clear bilaterally without rales, rhonchi, or wheezing.  Abdomen: Soft, nontender, no masses. Normal bowel sounds. Extremities: No clubbing, cyanosis, erythema. No edema.  Skin: Warm and dry. Neurologic: No focal deficits.  Psychiatric: Normal affect and thought content.   Results for orders placed or performed in visit on 05/17/18  Comprehensive metabolic panel  Result Value Ref Range   Sodium 138 135 - 145 mEq/L   Potassium 4.2 3.5 - 5.1 mEq/L   Chloride 105 96 - 112 mEq/L   CO2 25 19 - 32 mEq/L   Glucose, Bld 99 70 - 99 mg/dL   BUN 12 6 - 23 mg/dL   Creatinine, Ser 0.56 0.40 - 1.20 mg/dL   Total Bilirubin 0.4 0.2 - 1.2 mg/dL   Alkaline Phosphatase 79 39 - 117 U/L   AST 19 0 - 37 U/L   ALT 27 0 - 35 U/L   Total Protein 6.4 6.0 - 8.3 g/dL   Albumin 4.3 3.5 - 5.2 g/dL   Calcium 9.0 8.4 - 10.5 mg/dL   GFR 115.53 >60.00 mL/min  Hemoglobin A1c  Result Value Ref Range   Hgb A1c MFr Bld 5.5 4.6 - 6.5 %   . Reviewed expectations re: course of current medical issues. . Discussed self-management of symptoms. . Outlined signs and symptoms indicating need for more acute intervention. . Patient verbalized understanding and all questions were answered. Marland Kitchen Health Maintenance issues including appropriate healthy diet, exercise, and smoking avoidance were discussed with patient. . See orders for this visit as documented in the electronic medical  record. . Patient received an After Visit Summary.  Briscoe Deutscher, DO [place] 05/21/2018

## 2018-05-21 DIAGNOSIS — R600 Localized edema: Secondary | ICD-10-CM | POA: Insufficient documentation

## 2018-05-21 DIAGNOSIS — E88819 Insulin resistance, unspecified: Secondary | ICD-10-CM | POA: Insufficient documentation

## 2018-05-21 DIAGNOSIS — R7989 Other specified abnormal findings of blood chemistry: Secondary | ICD-10-CM | POA: Insufficient documentation

## 2018-05-21 DIAGNOSIS — R945 Abnormal results of liver function studies: Secondary | ICD-10-CM

## 2018-05-21 DIAGNOSIS — E8881 Metabolic syndrome: Secondary | ICD-10-CM | POA: Insufficient documentation

## 2018-05-21 HISTORY — DX: Other specified abnormal findings of blood chemistry: R79.89

## 2018-05-23 ENCOUNTER — Encounter: Payer: Self-pay | Admitting: Family Medicine

## 2018-08-09 ENCOUNTER — Other Ambulatory Visit: Payer: Self-pay | Admitting: Family Medicine

## 2018-08-09 DIAGNOSIS — R600 Localized edema: Secondary | ICD-10-CM

## 2018-08-20 ENCOUNTER — Encounter: Payer: Self-pay | Admitting: Family Medicine

## 2018-08-20 NOTE — Progress Notes (Signed)
Virtual Visit via Video   Due to the COVID-19 pandemic, this visit was completed with telemedicine (audio/video) technology to reduce patient and provider exposure as well as to preserve personal protective equipment.   I connected with Carmen Simon by a video enabled telemedicine application and verified that I am speaking with the correct person using two identifiers. Location patient: Home Location provider: [facility], Office Persons participating in the virtual visit: SARABELLA CAPRIO, Briscoe Deutscher, DO [name], CMA acting as scribe for Dr. Briscoe Deutscher.    I discussed the limitations of evaluation and management by telemedicine and the availability of in person appointments. The patient expressed understanding and agreed to proceed.  Care Team   Patient Care Team: Briscoe Deutscher, DO as PCP - General (Family Medicine)  Subjective:   HPI: Following up on edema and lab work. She is still having issues with edema. Pitting. Has been taking HCTZ 12.5 mg fairly regularly but not much help. Had increased carbohydrate intake due to food-finding difficulty during COVID. States that she "fell off the wagon." Now, eating lower carbohydrate again. Walking for exercise and hoping to start riding bike. Loved going to [place] but can't get to gym. Weight today down to 190 pounds per patient, which is 6 pounds down from last visit.   Review of Systems  Constitutional: Negative for chills, fever, malaise/fatigue and weight loss.  Respiratory: Negative for cough, shortness of breath and wheezing.   Cardiovascular: Negative for chest pain, palpitations and leg swelling.  Gastrointestinal: Negative for abdominal pain, constipation, diarrhea, nausea and vomiting.  Genitourinary: Negative for dysuria and urgency.  Musculoskeletal: Negative for joint pain and myalgias.  Skin: Negative for rash.  Neurological: Negative for dizziness and headaches.  Psychiatric/Behavioral: Negative for  depression, substance abuse and suicidal ideas. The patient is not nervous/anxious.     Patient Active Problem List   Diagnosis Date Noted  . Insulin resistance 05/21/2018  . Bilateral lower extremity edema 05/21/2018  . Anxiety 02/15/2018  . Seasonal allergies   . Gluten intolerance   . GERD (gastroesophageal reflux disease)   . Asthma, cough variant   . 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 Cubital tunnel syndrome on right 06/29/2012  . TFCC (triangular fibrocartilage complex) tear 06/29/2012    Social History   Tobacco Use  . Smoking status: Never Smoker  . Smokeless tobacco: Never Used  Substance Use Topics  . Alcohol use: Yes    Alcohol/week: 1.0 standard drinks    Types: 1 Glasses of wine per week   Current Outpatient Medications:  .  acetaminophen (TYLENOL) 325 MG tablet, Take 650 mg by mouth every 6 (six) hours as needed for moderate pain., Disp: , Rfl:  .  ALBUTEROL IN, Inhale 2 puffs into the lungs as needed. , Disp: , Rfl:  .  cetirizine (ZYRTEC) 10 MG tablet, Take 10 mg by mouth every morning. , Disp: , Rfl:  .  Cholecalciferol (VITAMIN D3) 2000 UNITS TABS, Take 1 tablet by mouth daily. , Disp: , Rfl:  .  Evening Primrose Oil 500 MG CAPS, Take 1 capsule by mouth daily. , Disp: , Rfl:  .  hydrochlorothiazide (MICROZIDE) 12.5 MG capsule, TAKE 1 CAPSULE BY MOUTH EVERY DAY, Disp: 90 capsule, Rfl: 0 .  levonorgestrel (MIRENA) 20 MCG/24HR IUD, 1 each by Intrauterine route once. Sees GYN, IUD Expires in 2021, Disp: , Rfl:  .  montelukast (SINGULAIR) 10 MG tablet, Take 10 mg by mouth at bedtime., Disp: ,  Rfl:  .  OVER THE COUNTER MEDICATION, Take 1 capsule by mouth daily. 5HTP, Disp: , Rfl:  .  Probiotic Product (PROBIOTIC DAILY PO), Take 1 tablet by mouth daily. Cuterelle, Disp: , Rfl:  .  pyridOXINE (B-6) 50 MG tablet, Take by mouth., Disp: , Rfl:  .  vitamin B-12 (CYANOCOBALAMIN) 500 MCG tablet, Take 500 mcg by mouth daily., Disp: , Rfl:   Allergies    Allergen Reactions  . Aspirin Tinitus  . Gluten Meal Swelling    "retains water'  . Erythromycin Other (See Comments)    Stomach cramps   Objective:   VITALS: Per patient if applicable, see vitals. GENERAL: Alert, appears well and in no acute distress. HEENT: Atraumatic, conjunctiva clear, no obvious abnormalities on inspection of external nose and ears. NECK: Normal movements of the head and neck. CARDIOPULMONARY: No increased WOB. Speaking in clear sentences. I:E ratio WNL.  MS: Moves all visible extremities without noticeable abnormality. PSYCH: Pleasant and cooperative, well-groomed. Speech normal rate and rhythm. Affect is appropriate. Insight and judgement are appropriate. Attention is focused, linear, and appropriate.  NEURO: CN grossly intact. Oriented as arrived to appointment on time with no prompting. Moves both UE equally.  SKIN: No obvious lesions, wounds, erythema, or cyanosis noted on face or hands.  Depression screen [facility] 2/9 08/21/2018 05/17/2018 02/15/2018  Decreased Interest 0 0 0  Down, Depressed, Hopeless 0 0 0  PHQ - 2 Score 0 0 0  Altered sleeping 2 1 -  Tired, decreased energy 0 2 -  Change in appetite 0 0 -  Feeling bad or failure about yourself  0 0 -  Trouble concentrating 0 1 -  Moving slowly or fidgety/restless 0 0 -  Suicidal thoughts 0 0 -  PHQ-9 Score 2 4 -  Difficult doing work/chores Not difficult at all Not difficult at all -   Assessment and Plan:   Carmen Simon was seen today for follow-up.  Diagnoses and all orders for this visit:  Insulin resistance -     Hemoglobin A1c; Future  Asthma, cough variant  Obesity (BMI 30-39.9)  Bilateral lower extremity edema -     CBC with Differential/Platelet; Future -     Comprehensive metabolic panel; Future -     Magnesium; Future -     triamterene-hydrochlorothiazide (MAXZIDE-25) 37.5-25 MG tablet; Take 1 tablet by mouth daily.  Acquired hypothyroidism -     TSH; Future -     T4, free;  Future  Screening for lipid disorders -     Lipid panel; Future  Continue diet and exercise. Medication changes per orders. Follow up in 3 months.   . COVID-19 Education: The signs and symptoms of COVID-19 were discussed with the patient and how to seek care for testing if needed. The importance of social distancing was discussed today. . Reviewed expectations re: course of current medical issues. . Discussed self-management of symptoms. . Outlined signs and symptoms indicating need for more acute intervention. . Patient verbalized understanding and all questions were answered. Marland Kitchen Health Maintenance issues including appropriate healthy diet, exercise, and smoking avoidance were discussed with patient. . See orders for this visit as documented in the electronic medical record.  Briscoe Deutscher, DO  Records requested if needed. Time spent: 25 minutes, of which >50% was spent in obtaining information about her symptoms, reviewing her previous labs, evaluations, and treatments, counseling her about her condition (please see the discussed topics above), and developing a plan to further investigate it; she had  a number of questions which I addressed.

## 2018-08-21 ENCOUNTER — Ambulatory Visit (INDEPENDENT_AMBULATORY_CARE_PROVIDER_SITE_OTHER): Payer: BC Managed Care – PPO | Admitting: Family Medicine

## 2018-08-21 ENCOUNTER — Encounter: Payer: Self-pay | Admitting: Family Medicine

## 2018-08-21 ENCOUNTER — Other Ambulatory Visit: Payer: Self-pay

## 2018-08-21 VITALS — Ht 64.0 in | Wt 190.0 lb

## 2018-08-21 DIAGNOSIS — R600 Localized edema: Secondary | ICD-10-CM

## 2018-08-21 DIAGNOSIS — E039 Hypothyroidism, unspecified: Secondary | ICD-10-CM

## 2018-08-21 DIAGNOSIS — E669 Obesity, unspecified: Secondary | ICD-10-CM

## 2018-08-21 DIAGNOSIS — E88819 Insulin resistance, unspecified: Secondary | ICD-10-CM

## 2018-08-21 DIAGNOSIS — Z13220 Encounter for screening for lipoid disorders: Secondary | ICD-10-CM

## 2018-08-21 DIAGNOSIS — J45991 Cough variant asthma: Secondary | ICD-10-CM

## 2018-08-21 DIAGNOSIS — E8881 Metabolic syndrome: Secondary | ICD-10-CM

## 2018-08-21 MED ORDER — TRIAMTERENE-HCTZ 37.5-25 MG PO TABS: 1.0000 | ORAL_TABLET | Freq: Every day | ORAL | 3 refills | Status: DC

## 2018-09-04 ENCOUNTER — Encounter: Payer: Self-pay | Admitting: Family Medicine

## 2018-10-23 ENCOUNTER — Encounter: Payer: Self-pay | Admitting: Family Medicine

## 2018-10-27 ENCOUNTER — Encounter: Payer: Self-pay | Admitting: Family Medicine

## 2018-11-21 NOTE — Progress Notes (Signed)
Carmen Simon is a 48 y.o. female is here for follow up.  History of Present Illness:   Carmen Simon, CMA acting as scribe for Dr. Briscoe Deutscher.   HPI: Patient in for follow up. She is still having some swelling on both feet and legs. She has been taking diuretic daily.  Two times in the last few months she has had issue where purples are different sizes. One time she developed a bad migraine right after. She has no new medications She did not have any abnormal amount of caffeine either time.   Health Maintenance Due  Topic Date Due  . INFLUENZA VACCINE  10/07/2018   Depression screen [facility] 2/9 08/21/2018 05/17/2018 02/15/2018  Decreased Interest 0 0 0  Down, Depressed, Hopeless 0 0 0  PHQ - 2 Score 0 0 0  Altered sleeping 2 1 -  Tired, decreased energy 0 2 -  Change in appetite 0 0 -  Feeling bad or failure about yourself  0 0 -  Trouble concentrating 0 1 -  Moving slowly or fidgety/restless 0 0 -  Suicidal thoughts 0 0 -  PHQ-9 Score 2 4 -  Difficult doing work/chores Not difficult at all Not difficult at all -   PMHx, SurgHx, SocialHx, FamHx, Medications, and Allergies were reviewed in the Visit Navigator and updated as appropriate.   Patient Active Problem List   Diagnosis Date Noted  . Insulin resistance 05/21/2018  . Bilateral lower extremity edema 05/21/2018  . Anxiety 02/15/2018  . Seasonal allergies   . Gluten intolerance   . GERD (gastroesophageal reflux disease)   . Asthma, cough variant   . 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 Cubital tunnel syndrome on right 06/29/2012  . TFCC (triangular fibrocartilage complex) tear 06/29/2012   Social History   Tobacco Use  . Smoking status: Never Smoker  . Smokeless tobacco: Never Used  Substance Use Topics  . Alcohol use: Yes    Alcohol/week: 1.0 standard drinks    Types: 1 Glasses of wine per week  . Drug use: No   Current Medications and Allergies   Current Outpatient Medications:  .   acetaminophen (TYLENOL) 325 MG tablet, Take 650 mg by mouth every 6 (six) hours as needed for moderate pain., Disp: , Rfl:  .  ALBUTEROL IN, Inhale 2 puffs into the lungs as needed. , Disp: , Rfl:  .  cetirizine (ZYRTEC) 10 MG tablet, Take 10 mg by mouth every morning. , Disp: , Rfl:  .  Cholecalciferol (VITAMIN D3) 2000 UNITS TABS, Take 1 tablet by mouth daily. , Disp: , Rfl:  .  levonorgestrel (MIRENA) 20 MCG/24HR IUD, 1 each by Intrauterine route once. Sees GYN, IUD Expires in 2021, Disp: , Rfl:  .  montelukast (SINGULAIR) 10 MG tablet, Take 10 mg by mouth at bedtime., Disp: , Rfl:  .  OVER THE COUNTER MEDICATION, Take 1 capsule by mouth daily. 5HTP, Disp: , Rfl:  .  Probiotic Product (PROBIOTIC DAILY PO), Take 1 tablet by mouth daily. Cuterelle, Disp: , Rfl:  .  pyridOXINE (B-6) 50 MG tablet, Take by mouth., Disp: , Rfl:  .  triamterene-hydrochlorothiazide (MAXZIDE-25) 37.5-25 MG tablet, Take 1 tablet by mouth daily., Disp: 90 tablet, Rfl: 3 .  vitamin B-12 (CYANOCOBALAMIN) 500 MCG tablet, Take 500 mcg by mouth daily., Disp: , Rfl:    Allergies  Allergen Reactions  . Aspirin Tinitus  . Gluten Meal Swelling    "retains water'  . Erythromycin  Other (See Comments)    Stomach cramps   Review of Systems   Pertinent items are noted in the HPI. Otherwise, a complete ROS is negative.  Vitals   Vitals:   11/22/18 0743  BP: 138/80  Pulse: 86  Temp: 98 F (36.7 C)  TempSrc: Oral  SpO2: 98%  Weight: 191 lb (86.6 kg)  Height: 5\' 4"  (1.626 m)     Body mass index is 32.79 kg/m.  Physical Exam   Physical Exam Vitals signs and nursing note reviewed.  HENT:     Head: Normocephalic and atraumatic.     Right Ear: Tympanic membrane and external ear normal.     Left Ear: Tympanic membrane and external ear normal.     Nose: Nose normal.     Mouth/Throat:     Mouth: Mucous membranes are moist.  Eyes:     Extraocular Movements: Extraocular movements intact.     Conjunctiva/sclera:  Conjunctivae normal.     Pupils: Pupils are equal, round, and reactive to light.  Neck:     Musculoskeletal: Normal range of motion and neck supple.  Cardiovascular:     Rate and Rhythm: Normal rate and regular rhythm.     Heart sounds: Murmur present.  Pulmonary:     Effort: Pulmonary effort is normal.  Abdominal:     Palpations: Abdomen is soft.  Musculoskeletal:     Right lower leg: Edema present.     Left lower leg: Edema present.  Skin:    General: Skin is warm.     Capillary Refill: Capillary refill takes less than 2 seconds.  Neurological:     General: No focal deficit present.     Cranial Nerves: No cranial nerve deficit.     Sensory: No sensory deficit.     Motor: No weakness.     Coordination: Coordination normal.     Gait: Gait normal.     Deep Tendon Reflexes: Reflexes normal.  Psychiatric:        Behavior: Behavior normal.    Assessment and Plan   Carmen Simon was seen today for follow-up.  Diagnoses and all orders for this visit:  Bilateral lower extremity edema Comments: Continues despite Maxide use. Pitting to shin today.  Orders: -     ECHOCARDIOGRAM COMPLETE; Future -     EKG 12-Lead -     Brain natriuretic peptide  Insulin resistance  Obesity (BMI 30-39.9)  Ophthalmoplegic migraine, not intractable -     Ambulatory referral to Neurology  Hot flashes Comments: New. Mirena x 5 years, with intent to take out next year. Followed by GYN. Patient requests labs. Orders: -     LH -     FSH -     17-Hydroxyprogesterone -     Testosterone, Free & Total-Female [facility]  Gluten intolerance Comments: Gluten free x past 8 years. Helped with pain and headaches.  Pupil asymmetry Comments: With migraine immediately after. Two episodes - one 5 months ago, the other 1 month ago. No triggers. Lasted < 1 day.    . Orders and follow up as documented in [place], reviewed diet, exercise and weight control, cardiovascular risk and specific lipid/LDL goals  reviewed, reviewed medications and side effects in detail.  . Reviewed expectations re: course of current medical issues. . Outlined signs and symptoms indicating need for more acute intervention. . Patient verbalized understanding and all questions were answered. . Patient received an After Visit Summary.  CMA served as Education administrator during this visit. History,  Physical, and Plan performed by medical provider. The above documentation has been reviewed and is accurate and complete. Briscoe Deutscher, D.O.  Briscoe Deutscher, DO [place], [facility] 11/22/2018

## 2018-11-22 ENCOUNTER — Other Ambulatory Visit: Payer: Self-pay

## 2018-11-22 ENCOUNTER — Encounter: Payer: Self-pay | Admitting: Family Medicine

## 2018-11-22 ENCOUNTER — Ambulatory Visit: Payer: BC Managed Care – PPO | Admitting: Family Medicine

## 2018-11-22 VITALS — BP 138/80 | HR 86 | Temp 98.0°F | Ht 64.0 in | Wt 191.0 lb

## 2018-11-22 DIAGNOSIS — Z13220 Encounter for screening for lipoid disorders: Secondary | ICD-10-CM | POA: Diagnosis not present

## 2018-11-22 DIAGNOSIS — K9041 Non-celiac gluten sensitivity: Secondary | ICD-10-CM

## 2018-11-22 DIAGNOSIS — E669 Obesity, unspecified: Secondary | ICD-10-CM

## 2018-11-22 DIAGNOSIS — R232 Flushing: Secondary | ICD-10-CM | POA: Diagnosis not present

## 2018-11-22 DIAGNOSIS — E039 Hypothyroidism, unspecified: Secondary | ICD-10-CM

## 2018-11-22 DIAGNOSIS — R600 Localized edema: Secondary | ICD-10-CM

## 2018-11-22 DIAGNOSIS — E8881 Metabolic syndrome: Secondary | ICD-10-CM | POA: Diagnosis not present

## 2018-11-22 DIAGNOSIS — Z23 Encounter for immunization: Secondary | ICD-10-CM | POA: Diagnosis not present

## 2018-11-22 DIAGNOSIS — G43B0 Ophthalmoplegic migraine, not intractable: Secondary | ICD-10-CM

## 2018-11-22 DIAGNOSIS — H5702 Anisocoria: Secondary | ICD-10-CM

## 2018-11-22 DIAGNOSIS — E88819 Insulin resistance, unspecified: Secondary | ICD-10-CM

## 2018-11-22 LAB — CBC WITH DIFFERENTIAL/PLATELET
Basophils Absolute: 0 10*3/uL (ref 0.0–0.1)
Basophils Relative: 0.6 % (ref 0.0–3.0)
Eosinophils Absolute: 0.2 10*3/uL (ref 0.0–0.7)
Eosinophils Relative: 2.5 % (ref 0.0–5.0)
HCT: 42.3 % (ref 36.0–46.0)
Hemoglobin: 14.2 g/dL (ref 12.0–15.0)
Lymphocytes Relative: 25.9 % (ref 12.0–46.0)
Lymphs Abs: 2.1 10*3/uL (ref 0.7–4.0)
MCHC: 33.6 g/dL (ref 30.0–36.0)
MCV: 89.3 fl (ref 78.0–100.0)
Monocytes Absolute: 0.4 10*3/uL (ref 0.1–1.0)
Monocytes Relative: 5.4 % (ref 3.0–12.0)
Neutro Abs: 5.3 10*3/uL (ref 1.4–7.7)
Neutrophils Relative %: 65.6 % (ref 43.0–77.0)
Platelets: 289 10*3/uL (ref 150.0–400.0)
RBC: 4.73 Mil/uL (ref 3.87–5.11)
RDW: 14 % (ref 11.5–15.5)
WBC: 8.1 10*3/uL (ref 4.0–10.5)

## 2018-11-22 LAB — COMPREHENSIVE METABOLIC PANEL
ALT: 43 U/L — ABNORMAL HIGH (ref 0–35)
AST: 19 U/L (ref 0–37)
Albumin: 4.7 g/dL (ref 3.5–5.2)
Alkaline Phosphatase: 78 U/L (ref 39–117)
BUN: 16 mg/dL (ref 6–23)
CO2: 29 mEq/L (ref 19–32)
Calcium: 10 mg/dL (ref 8.4–10.5)
Chloride: 102 mEq/L (ref 96–112)
Creatinine, Ser: 0.76 mg/dL (ref 0.40–1.20)
GFR: 81.04 mL/min (ref >60.00)
Glucose, Bld: 103 mg/dL — ABNORMAL HIGH (ref 70–99)
Potassium: 4.1 mEq/L (ref 3.5–5.1)
Sodium: 139 mEq/L (ref 135–145)
Total Bilirubin: 0.5 mg/dL (ref 0.2–1.2)
Total Protein: 7.1 g/dL (ref 6.0–8.3)

## 2018-11-22 LAB — LIPID PANEL
Cholesterol: 208 mg/dL — ABNORMAL HIGH (ref 0–200)
HDL: 51.3 mg/dL (ref >39.00)
LDL Cholesterol: 139 mg/dL — ABNORMAL HIGH (ref 0–99)
NonHDL: 156.28
Total CHOL/HDL Ratio: 4
Triglycerides: 87 mg/dL (ref 0.0–149.0)
VLDL: 17.4 mg/dL (ref 0.0–40.0)

## 2018-11-22 LAB — T4, FREE: Free T4: 0.79 ng/dL (ref 0.60–1.60)

## 2018-11-22 LAB — MAGNESIUM: Magnesium: 2 mg/dL (ref 1.5–2.5)

## 2018-11-22 LAB — HEMOGLOBIN A1C: Hgb A1c MFr Bld: 6 % (ref 4.6–6.5)

## 2018-11-22 LAB — LUTEINIZING HORMONE: LH: 31.39 m[IU]/mL

## 2018-11-22 LAB — BRAIN NATRIURETIC PEPTIDE: Pro B Natriuretic peptide (BNP): 26 pg/mL (ref 0.0–100.0)

## 2018-11-22 LAB — TSH: TSH: 1.73 u[IU]/mL (ref 0.35–4.50)

## 2018-11-22 LAB — FOLLICLE STIMULATING HORMONE: FSH: 59 m[IU]/mL

## 2018-11-25 LAB — 17-HYDROXYPROGESTERONE: 17-OH-Progesterone, LC/MS/MS: 21 ng/dL

## 2018-11-26 LAB — TESTOS,TOTAL,FREE AND SHBG (FEMALE)
Free Testosterone: 2.6 pg/mL (ref 0.1–6.4)
Sex Hormone Binding: 20 nmol/L (ref 17–124)
Testosterone, Total, LC-MS-MS: 17 ng/dL (ref 2–45)

## 2018-11-27 ENCOUNTER — Encounter: Payer: Self-pay | Admitting: Neurology

## 2018-11-29 ENCOUNTER — Other Ambulatory Visit: Payer: Self-pay

## 2018-11-29 ENCOUNTER — Ambulatory Visit (HOSPITAL_COMMUNITY): Payer: BC Managed Care – PPO | Attending: Cardiology

## 2018-11-29 DIAGNOSIS — R600 Localized edema: Secondary | ICD-10-CM | POA: Insufficient documentation

## 2018-11-30 ENCOUNTER — Encounter: Payer: Self-pay | Admitting: Family Medicine

## 2018-12-07 NOTE — Telephone Encounter (Signed)
Copied from [place] [id number]. Topic: General - Other >> Dec 07, 2018  1:34 PM Ivar Drape wrote: Reason for CRM:  Patient stated she received a telephone call from a nurse to return the call but she was talking so fast when she left the message she couldn't recognize the name.

## 2018-12-11 ENCOUNTER — Other Ambulatory Visit: Payer: Self-pay

## 2018-12-11 ENCOUNTER — Telehealth: Payer: Self-pay | Admitting: Family Medicine

## 2018-12-11 DIAGNOSIS — R7989 Other specified abnormal findings of blood chemistry: Secondary | ICD-10-CM

## 2018-12-11 NOTE — Telephone Encounter (Signed)
See note  Copied from [place] [phone]. Topic: General - Other >> Dec 07, 2018  1:34 PM Ivar Drape wrote: Reason for CRM:  Patient stated she received a telephone call from a nurse to return the call but she was talking so fast when she left the message she couldn't recognize the name. >> Dec 11, 2018 12:51 PM Leward Quan A wrote: Patient called to say that she received a call from possibly Sheria Lang twice and was trying to reach her. Patient can be reached at Ph# [phone]

## 2018-12-11 NOTE — Telephone Encounter (Signed)
See results not for documentation.  

## 2018-12-14 ENCOUNTER — Encounter: Payer: Self-pay | Admitting: Family Medicine

## 2018-12-14 NOTE — Progress Notes (Signed)
NEUROLOGY CONSULTATION NOTE  JOSEY TREICHEL MRN: [id number] DOB: Jul 10, 1970  Referring provider: Briscoe Deutscher, DO Primary care provider: Briscoe Deutscher, DO  Reason for consult:  Asymmetric pupil  HISTORY OF PRESENT ILLNESS: Carmen Simon is a 48 year old right-handed female who presents for ocular migraines.  History supplemented by referring provider note.  She started having migraines at age 55.  They are typically moderate pressure pain between her eyes that travel up to back of head.  They were associated with fatigue as well as a little photophobia and phonophobia.  No associated nausea and vomiting.  Initially, they resolved after changing to a gluten-free diet.  They returned about 6 years ago, due to hormonal changes, and she began having a preceding aura, seeing floaters and bright lights in her peripheral vision followed by tunnel vision lasting 10 minutes.  Headache would last 6 hours (or immediately if treated at time of aura).  They occurred for 6 months and then resolved.  Twice over the past 2 months, she had asymmetric pupils, one was pinpoint and the other was a little larger than normal.  It lasted about 2-3 hours and resolved.  The first time, there was no associated headache.  The second time, it lasted about an hour and was followed by one of her migraines.  She drank caffeine and took analgesic, lasting only an hour.  She cannot figure out any particular trigger.    Rescue protocol:  Dr. Malachi Bonds with ibuprofen 800mg .  Current NSAIDS:  ibuprofen Current analgesics:  none Current triptans:  none Current ergotamine:  none Current anti-emetic:  none Current muscle relaxants:  none Current anti-anxiolytic:  none Current sleep aide:  none Current Antihypertensive medications:  Maxzide-25 Current Antidepressant medications:  none Current Anticonvulsant medications:  none Current anti-CGRP:  none Current Vitamins/Herbal/Supplements:  B12, B6, D3, 5HTP Current  Antihistamines/Decongestants:  Zyrtec Other therapy:  none Hormone/birth control:  Mirena for 5 years.  Plan for removal next year.  Past NSAIDS:  ASA (tinnitus, lightheadedness) Past analgesics:  Tylenol Past abortive triptans:  Sumatriptan tablet Past abortive ergotamine:  none Past muscle relaxants:  none Past anti-emetic:  none Past antihypertensive medications:  none Past antidepressant medications:  none Past anticonvulsant medications:  none Past anti-CGRP:  none Past vitamins/Herbal/Supplements:  none Past antihistamines/decongestants:  none Other past therapies:  none  Caffeine:  1 cup of coffee daily.  One cola or Dr. Malachi Bonds a week at most. Diet:  6 to 8 glasses of water daily.  Gluten-free diet.  Does not skip meals Exercise:  Not routine Depression:  no; Anxiety:  some Other pain:  no Sleep hygiene:  6 hours Family history of headache:  No  TSH and Hgb A1c from 11/22/2018 normal.   PAST MEDICAL HISTORY: Past Medical History:  Diagnosis Date  . Asthma, cough variant   . Elevated LFTs 05/21/2018  . GERD (gastroesophageal reflux disease)    OCCASIONAL-- TAKE APPLE CIDER VINAGER  . Gluten intolerance    "causes retained water"  . History of chicken pox   . History of HPV infection   . History of palpitations    pvc'S  . Left ureteral calculus   . Nocturia   . PONV (postoperative nausea and vomiting)   . Seasonal allergies   . Thyroid disease    Hypothyroidism  . Wears glasses     PAST SURGICAL HISTORY: Past Surgical History:  Procedure Laterality Date  . CESAREAN SECTION  12-28-2000  . CHOLECYSTECTOMY  2004  .  D & C HYSTEROSOCPY W/ POLYPECTOMY  09/ 2000  . GYNECOLOGIC CRYOSURGERY  1992  . INTRAUTERINE DEVICE INSERTION  2008   MIRENA  . OSTEOTOMY AND ULNAR SHORTENING Right 2017  . TRANSTHORACIC ECHOCARDIOGRAM  10-27-2010   normal echo/  ef 65%  . ULNAR TUNNEL RELEASE Left 05/ 2014   LEFT ELBOW -- REVISION 11/ 2015    MEDICATIONS: Current  Outpatient Medications on File Prior to Visit  Medication Sig Dispense Refill  . acetaminophen (TYLENOL) 325 MG tablet Take 650 mg by mouth every 6 (six) hours as needed for moderate pain.    . ALBUTEROL IN Inhale 2 puffs into the lungs as needed.     . cetirizine (ZYRTEC) 10 MG tablet Take 10 mg by mouth every morning.     . Cholecalciferol (VITAMIN D3) 2000 UNITS TABS Take 1 tablet by mouth daily.     Marland Kitchen levonorgestrel (MIRENA) 20 MCG/24HR IUD 1 each by Intrauterine route once. Sees GYN, IUD Expires in 2021    . montelukast (SINGULAIR) 10 MG tablet Take 10 mg by mouth at bedtime.    Marland Kitchen OVER THE COUNTER MEDICATION Take 1 capsule by mouth daily. 5HTP    . Probiotic Product (PROBIOTIC DAILY PO) Take 1 tablet by mouth daily. Cuterelle    . pyridOXINE (B-6) 50 MG tablet Take by mouth.    . triamterene-hydrochlorothiazide (MAXZIDE-25) 37.5-25 MG tablet Take 1 tablet by mouth daily. 90 tablet 3  . vitamin B-12 (CYANOCOBALAMIN) 500 MCG tablet Take 500 mcg by mouth daily.     No current facility-administered medications on file prior to visit.     ALLERGIES: Allergies  Allergen Reactions  . Aspirin Tinitus  . Gluten Meal Swelling    "retains water'  . Erythromycin Other (See Comments)    Stomach cramps    FAMILY HISTORY: No family history on file.   SOCIAL HISTORY: Social History   Socioeconomic History  . Marital status: Married    Spouse name: Not on file  . Number of children: Not on file  . Years of education: Not on file  . Highest education level: Not on file  Occupational History  . Not on file  Social Needs  . Financial resource strain: Not on file  . Food insecurity    Worry: Not on file    Inability: Not on file  . Transportation needs    Medical: Not on file    Non-medical: Not on file  Tobacco Use  . Smoking status: Never Smoker  . Smokeless tobacco: Never Used  Substance and Sexual Activity  . Alcohol use: Yes    Alcohol/week: 1.0 standard drinks    Types:  1 Glasses of wine per week  . Drug use: No  . Sexual activity: Yes    Birth control/protection: I.U.D.    Comment: Mirena  Lifestyle  . Physical activity    Days per week: Not on file    Minutes per session: Not on file  . Stress: Not on file  Relationships  . Social Herbalist on phone: Not on file    Gets together: Not on file    Attends religious service: Not on file    Active member of club or organization: Not on file    Attends meetings of clubs or organizations: Not on file    Relationship status: Not on file  . Intimate partner violence    Fear of current or ex partner: Not on file    Emotionally  abused: Not on file    Physically abused: Not on file    Forced sexual activity: Not on file  Other Topics Concern  . Not on file  Social History Narrative  . Not on file    REVIEW OF SYSTEMS: Constitutional: No fevers, chills, or sweats, no generalized fatigue, change in appetite Eyes: No visual changes, double vision, eye pain Ear, nose and throat: No hearing loss, ear pain, nasal congestion, sore throat Cardiovascular: No chest pain, palpitations Respiratory:  No shortness of breath at rest or with exertion, wheezes GastrointestinaI: No nausea, vomiting, diarrhea, abdominal pain, fecal incontinence Genitourinary:  No dysuria, urinary retention or frequency Musculoskeletal:  No neck pain, back pain Integumentary: No rash, pruritus, skin lesions Neurological: as above Psychiatric: No depression, insomnia, anxiety Endocrine: No palpitations, fatigue, diaphoresis, mood swings, change in appetite, change in weight, increased thirst Hematologic/Lymphatic:  No purpura, petechiae. Allergic/Immunologic: no itchy/runny eyes, nasal congestion, recent allergic reactions, rashes  PHYSICAL EXAM: Blood pressure 126/83, pulse 99, temperature 98.5 F (36.9 C), height 5\' 4"  (1.626 m), weight 191 lb 9.6 oz (86.9 kg), SpO2 97 %. General: No acute distress.  Patient appears  well-groomed.   Head:  Normocephalic/atraumatic Eyes:  fundi examined but not visualized Neck: supple, no paraspinal tenderness, full range of motion Back: No paraspinal tenderness Heart: regular rate and rhythm Lungs: Clear to auscultation bilaterally. Vascular: No carotid bruits. Neurological Exam: Mental status: alert and oriented to person, place, and time, recent and remote memory intact, fund of knowledge intact, attention and concentration intact, speech fluent and not dysarthric, language intact. Cranial nerves: CN I: not tested CN II: pupils equal, round and reactive to light, visual fields intact CN III, IV, VI:  full range of motion, no nystagmus, no ptosis CN V: facial sensation intact CN VII: upper and lower face symmetric CN VIII: hearing intact CN IX, X: gag intact, uvula midline CN XI: sternocleidomastoid and trapezius muscles intact CN XII: tongue midline Bulk & Tone: normal, no fasciculations. Motor:  5/5 throughout  Sensation:  temperature and vibration sensation intact. Deep Tendon Reflexes:  2+ throughout, toes downgoing.   Finger to nose testing:  Without dysmetria.   Heel to shin:  Without dysmetria.   Gait:  Normal station and stride.  Able to turn and tandem walk. Romberg negative.  IMPRESSION: Migraine with aura, without status migrainosus, not intractable.   PLAN: 1.  Confident of diagnosis.  No further workup warranted at this time. 2.  Infrequent, so prophylaxis not warranted at this time.  If they become more frequent, she was advised to make a follow up appointment. 3.  Treat migraine earliest onset as already doing. 4.  I would recommend removing the Mirena as estrogen-based birth control are not recommended if patients experience aura with their migraines 5.  Follow up as needed.   Thank you for allowing me to take part in the care of this patient.  Metta Clines, DO  CC: Briscoe Deutscher, DO

## 2018-12-18 ENCOUNTER — Ambulatory Visit (INDEPENDENT_AMBULATORY_CARE_PROVIDER_SITE_OTHER): Payer: BC Managed Care – PPO | Admitting: Neurology

## 2018-12-18 ENCOUNTER — Encounter: Payer: Self-pay | Admitting: Neurology

## 2018-12-18 ENCOUNTER — Other Ambulatory Visit: Payer: Self-pay

## 2018-12-18 VITALS — BP 126/83 | HR 99 | Temp 98.5°F | Ht 64.0 in | Wt 191.6 lb

## 2018-12-18 DIAGNOSIS — G43109 Migraine with aura, not intractable, without status migrainosus: Secondary | ICD-10-CM | POA: Diagnosis not present

## 2018-12-18 NOTE — Patient Instructions (Signed)
I am confident that the asymmetric pupils are migraine. As long as they are infrequent, no medications required.  If they should increase in frequency, then please follow up I would recommend removing the Mirena as estrogen-based birth control are not recommended if patients experience aura with their migraines

## 2018-12-21 ENCOUNTER — Ambulatory Visit
Admission: RE | Admit: 2018-12-21 | Discharge: 2018-12-21 | Disposition: A | Discharge status: Home / Self Care | Payer: BC Managed Care – PPO | Source: Ambulatory Visit | Attending: Family Medicine | Admitting: Family Medicine

## 2018-12-21 DIAGNOSIS — R7989 Other specified abnormal findings of blood chemistry: Secondary | ICD-10-CM

## 2018-12-25 ENCOUNTER — Encounter: Payer: Self-pay | Admitting: Family Medicine

## 2018-12-26 ENCOUNTER — Other Ambulatory Visit: Payer: Self-pay | Admitting: Registered"

## 2018-12-26 DIAGNOSIS — Z20822 Contact with and (suspected) exposure to covid-19: Secondary | ICD-10-CM

## 2018-12-28 LAB — NOVEL CORONAVIRUS, NAA: SARS-CoV-2, NAA: NOT DETECTED

## 2019-02-09 ENCOUNTER — Other Ambulatory Visit: Payer: Self-pay

## 2019-02-09 DIAGNOSIS — Z20822 Contact with and (suspected) exposure to covid-19: Secondary | ICD-10-CM

## 2019-02-11 LAB — NOVEL CORONAVIRUS, NAA: SARS-CoV-2, NAA: NOT DETECTED

## 2019-02-26 ENCOUNTER — Encounter (INDEPENDENT_AMBULATORY_CARE_PROVIDER_SITE_OTHER): Payer: Self-pay | Admitting: Family Medicine

## 2019-02-26 NOTE — Telephone Encounter (Signed)
Please review

## 2019-02-28 NOTE — Telephone Encounter (Signed)
Called pt and LVM about scheduling TOC appts

## 2019-06-19 ENCOUNTER — Other Ambulatory Visit: Payer: Self-pay

## 2019-06-19 ENCOUNTER — Ambulatory Visit: Payer: BC Managed Care – PPO | Admitting: Family Medicine

## 2019-06-19 ENCOUNTER — Encounter: Payer: Self-pay | Admitting: Family Medicine

## 2019-06-19 VITALS — BP 118/82 | HR 78 | Temp 98.1°F | Resp 16 | Ht 64.0 in | Wt 195.8 lb

## 2019-06-19 DIAGNOSIS — R600 Localized edema: Secondary | ICD-10-CM

## 2019-06-19 DIAGNOSIS — R7303 Prediabetes: Secondary | ICD-10-CM

## 2019-06-19 DIAGNOSIS — Z0000 Encounter for general adult medical examination without abnormal findings: Secondary | ICD-10-CM

## 2019-06-19 DIAGNOSIS — J302 Other seasonal allergic rhinitis: Secondary | ICD-10-CM

## 2019-06-19 DIAGNOSIS — K9041 Non-celiac gluten sensitivity: Secondary | ICD-10-CM

## 2019-06-19 DIAGNOSIS — E669 Obesity, unspecified: Secondary | ICD-10-CM | POA: Diagnosis not present

## 2019-06-19 LAB — CBC WITH DIFFERENTIAL/PLATELET
Basophils Absolute: 0 10*3/uL (ref 0.0–0.1)
Basophils Relative: 0.5 % (ref 0.0–3.0)
Eosinophils Absolute: 0.2 10*3/uL (ref 0.0–0.7)
Eosinophils Relative: 1.9 % (ref 0.0–5.0)
HCT: 41.8 % (ref 36.0–46.0)
Hemoglobin: 14 g/dL (ref 12.0–15.0)
Lymphocytes Relative: 27.4 % (ref 12.0–46.0)
Lymphs Abs: 2.3 10*3/uL (ref 0.7–4.0)
MCHC: 33.5 g/dL (ref 30.0–36.0)
MCV: 88.8 fl (ref 78.0–100.0)
Monocytes Absolute: 0.5 10*3/uL (ref 0.1–1.0)
Monocytes Relative: 5.4 % (ref 3.0–12.0)
Neutro Abs: 5.5 10*3/uL (ref 1.4–7.7)
Neutrophils Relative %: 64.8 % (ref 43.0–77.0)
Platelets: 270 10*3/uL (ref 150.0–400.0)
RBC: 4.71 Mil/uL (ref 3.87–5.11)
RDW: 14.3 % (ref 11.5–15.5)
WBC: 8.4 10*3/uL (ref 4.0–10.5)

## 2019-06-19 LAB — COMPREHENSIVE METABOLIC PANEL
ALT: 24 U/L (ref 0–35)
AST: 15 U/L (ref 0–37)
Albumin: 4.7 g/dL (ref 3.5–5.2)
Alkaline Phosphatase: 75 U/L (ref 39–117)
BUN: 17 mg/dL (ref 6–23)
CO2: 28 mEq/L (ref 19–32)
Calcium: 10.2 mg/dL (ref 8.4–10.5)
Chloride: 102 mEq/L (ref 96–112)
Creatinine, Ser: 0.68 mg/dL (ref 0.40–1.20)
GFR: 91.92 mL/min (ref >60.00)
Glucose, Bld: 91 mg/dL (ref 70–99)
Potassium: 4.4 mEq/L (ref 3.5–5.1)
Sodium: 140 mEq/L (ref 135–145)
Total Bilirubin: 0.4 mg/dL (ref 0.2–1.2)
Total Protein: 6.8 g/dL (ref 6.0–8.3)

## 2019-06-19 LAB — LIPID PANEL
Cholesterol: 187 mg/dL (ref 0–200)
HDL: 46.3 mg/dL (ref >39.00)
LDL Cholesterol: 121 mg/dL — ABNORMAL HIGH (ref 0–99)
NonHDL: 141.12
Total CHOL/HDL Ratio: 4
Triglycerides: 103 mg/dL (ref 0.0–149.0)
VLDL: 20.6 mg/dL (ref 0.0–40.0)

## 2019-06-19 LAB — TSH: TSH: 1.78 u[IU]/mL (ref 0.35–4.50)

## 2019-06-19 LAB — POCT GLYCOSYLATED HEMOGLOBIN (HGB A1C): Hemoglobin A1C: 5.8 % — AB (ref 4.0–5.6)

## 2019-06-19 MED ORDER — FUROSEMIDE 20 MG PO TABS: 20.0000 mg | ORAL_TABLET | Freq: Every day | ORAL | 3 refills | Status: DC | PRN

## 2019-06-19 MED ORDER — POTASSIUM CHLORIDE CRYS ER 20 MEQ PO TBCR: 20.0000 meq | EXTENDED_RELEASE_TABLET | Freq: Every day | ORAL | 3 refills | Status: DC | PRN

## 2019-06-19 NOTE — Patient Instructions (Addendum)
Please return in 6 months for recheck of prediabetes and swelling.  Your A1c today is a little better at 5.8 which is the upper limits of normal. This is good news!  It was a pleasure meeting you today! Thank you for choosing [place] to meet your healthcare needs! I truly look forward to working with you. If you have any questions or concerns, please send me a message via Mychart or call the office at [phone].  Please do these things to maintain good health!   Exercise at least 30-45 minutes a day,  4-5 days a week.   Eat a low-fat diet with lots of fruits and vegetables, up to 7-9 servings per day.  Drink plenty of water daily. Try to drink 8 8oz glasses per day.  Seatbelts can save your life. Always wear your seatbelt.  Place Smoke Detectors on every level of your home and check batteries every year.  Schedule an appointment with an eye doctor for an eye exam every 1-2 years  Safe sex - use condoms to protect yourself from STDs if you could be exposed to these types of infections. Use birth control if you do not want to become pregnant and are sexually active.  Avoid heavy alcohol use. If you drink, keep it to less than 2 drinks/day and not every day.  [place].  Choose someone you trust that could speak for you if you became unable to speak for yourself.  Depression is common in our stressful world.If you're feeling down or losing interest in things you normally enjoy, please come in for a visit.  If anyone is threatening or hurting you, please get help. Physical or Emotional Violence is never OK.

## 2019-06-19 NOTE — Progress Notes (Signed)
Subjective  Chief Complaint  Patient presents with  . Transitions Of Care    CPE    HPI: Carmen Simon is a 49 y.o. female who presents to [facility] today for a Female Wellness Visit. She also has the concerns and/or needs as listed above in the chief complaint. These will be addressed in addition to the Health Maintenance Visit. Establishing care with me. I reviewed chart.   Wellness Visit: annual visit with health maintenance review and exam without Pap   HM: sees GYN: pap up to date. Has Mirena IUD; elevated FSH last year w/ menopausal sxs. 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 Struggling with diet: multiple barriers including responsibilities to family, work and ailing father with lewy body dementia.  Chronic disease f/u and/or acute problem visit: (deemed necessary to be done in addition to the wellness visit):  Prediabetes: no sxs of hyperglycemia.   Chronic lower ext edema: in part related to gluten sensitivities. On dyazide with only fair control. No h/o HTN.   SAR active.   Lab Results  Component Value Date   HGBA1C 5.8 (A) 06/19/2019   HGBA1C 6.0 11/22/2018   HGBA1C 5.5 05/17/2018     Assessment  1. Annual physical exam   2. Prediabetes   3. Bilateral lower extremity edema   4. Obesity (BMI 30-39.9)   5. Seasonal allergies   6. Gluten intolerance      Plan  Female Wellness Visit:  Age appropriate Health Maintenance and Prevention measures were discussed with patient. Included topics are cancer screening recommendations, ways to keep healthy (see AVS) including dietary and exercise recommendations, regular eye and dental care, use of seat belts, and avoidance of moderate alcohol use and tobacco use.   BMI: discussed patient's BMI and encouraged positive lifestyle modifications to help get to or maintain a target BMI.  HM needs and immunizations were addressed and ordered. See below  for orders. See HM and immunization section for updates.  Routine labs and screening tests ordered including cmp, cbc and lipids where appropriate.  Discussed recommendations regarding Vit D and calcium supplementation (see AVS)  Chronic disease management visit and/or acute problem visit:  Edema: change to lasix and adjust dose as needed.  Obesity: discussed diet rec and exercise recs.   Follow up: Return in about 6 months (around 12/19/2019) for recheck sugars and swelling.  Orders Placed This Encounter  Procedures  . CBC with Differential/Platelet  . Comprehensive metabolic panel  . Lipid panel  . TSH  . POCT HgB A1C   Meds ordered this encounter  Medications  . furosemide (LASIX) 20 MG tablet    Sig: Take 1 tablet (20 mg total) by mouth daily as needed for fluid or edema. Take with potassium    Dispense:  90 tablet    Refill:  3  . potassium chloride SA (KLOR-CON) 20 MEQ tablet    Sig: Take 1 tablet (20 mEq total) by mouth daily as needed. Take with Lasix    Dispense:  90 tablet    Refill:  3      Lifestyle: Body mass index is 33.61 kg/m. Wt Readings from Last 3 Encounters:  06/19/19 195 lb 12.8 oz (88.8 kg)  12/18/18 191 lb 9.6 oz (86.9 kg)  11/22/18 191 lb (86.6 kg)    Patient Active Problem List   Diagnosis Date Noted  . Obesity (BMI 30-39.9) 06/19/2019    Priority: High  .  Prediabetes 06/19/2019    Priority: High  . Bilateral lower extremity edema 05/21/2018    Priority: Medium  . Gluten intolerance     Priority: Medium  . GERD (gastroesophageal reflux disease)     Priority: Medium  . Asthma, cough variant     Priority: Medium  . Seasonal allergies     Priority: Low   Health Maintenance  Topic Date Due  . INFLUENZA VACCINE  10/07/2019  . PAP SMEAR-Modifier  06/02/2022  . TETANUS/TDAP  05/16/2028  . HIV Screening  Completed   Immunization History  Administered Date(s) Administered  . Influenza,inj,Quad PF,6+ Mos 11/22/2018  .  Influenza-Unspecified 11/28/2017  . Tdap 05/17/2018   We updated and reviewed the patient's past history in detail and it is documented below. Allergies: Patient is allergic to aspirin; gluten meal; and erythromycin. 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 Past Surgical History Patient  has a past surgical history that includes transthoracic echocardiogram (10-27-2010); Cesarean section (12-28-2000); D & C HYSTEROSOCPY W/ POLYPECTOMY (09/ 2000); Gynecologic cryosurgery (1992); Ulnar tunnel release (Left, 05/ 2014); Intrauterine device insertion (2008); Osteotomy and ulnar shortening (Right, 2017); and Cholecystectomy (2004). Family History: Patient family history includes Diabetes in her father; Stroke in her mother. Social History:  Patient  reports that she has never smoked. She has never used smokeless tobacco. She reports current alcohol use of about 1.0 standard drinks of alcohol per week. She reports that she does not use drugs.  Review of Systems: Constitutional: negative for fever or malaise Ophthalmic: negative for photophobia, double vision or loss of vision Cardiovascular: negative for chest pain, dyspnea on exertion, or new LE swelling Respiratory: negative for SOB or persistent cough Gastrointestinal: negative for abdominal pain, change in bowel habits or melena Genitourinary: negative for dysuria or gross hematuria, no abnormal uterine bleeding or disharge Musculoskeletal: negative for new gait disturbance or muscular weakness Integumentary: negative for new or persistent rashes, no breast lumps Neurological: negative for TIA or stroke symptoms Psychiatric: negative for SI or delusions Allergic/Immunologic:  negative for hives  Patient Care Team    Relationship Specialty Notifications Start End  Leamon Arnt, MD PCP - General Family Medicine  06/19/19   Pieter Partridge, DO Consulting Physician Neurology  12/18/18   Leamon Arnt, MD Consulting Physician Family Medicine  06/19/19   Brien Few, MD Consulting Physician Obstetrics and Gynecology  06/19/19     Objective  Vitals: BP 118/82   Pulse 78   Temp 98.1 F (36.7 C) (Temporal)   Resp 16   Ht 5\' 4"  (1.626 m)   Wt 195 lb 12.8 oz (88.8 kg)   SpO2 98%   BMI 33.61 kg/m  General:  Well developed, well nourished, no acute distress  Psych:  Alert and orientedx3,normal mood and affect HEENT:  Normocephalic, atraumatic, non-icteric sclera,  supple neck without adenopathy, mass or thyromegaly Cardiovascular:  Normal S1, S2, RRR without gallop, rub or murmur, + 2 + pitting edema to mid calves bilatarally Respiratory:  Good breath sounds bilaterally, CTAB with normal respiratory effort Gastrointestinal: normal bowel sounds, soft, non-tender, no noted masses. No HSM MSK: no deformities, contusions. Joints are without erythema or swelling.  Skin:  Warm, no rashes or suspicious lesions noted Neurologic:    Mental status is normal. CN 2-11  are normal. Gross motor and sensory exams are normal. Normal gait. Fine tremor    Commons side effects, risks, benefits, and alternatives for medications and treatment plan prescribed today were discussed, and the patient expressed understanding of the given instructions. Patient is instructed to call or message via MyChart if he/she has any questions or concerns regarding our treatment plan. No barriers to understanding were identified. We discussed Red Flag symptoms and signs in detail. Patient expressed understanding regarding what to do in case of urgent or emergency type symptoms.   Medication list was reconciled, printed and provided to the patient in AVS. Patient instructions and summary information was  reviewed with the patient as documented in the AVS. This note was prepared with assistance of Dragon voice recognition software. Occasional wrong-word or sound-a-like substitutions may have occurred due to the inherent limitations of voice recognition software  This visit occurred during the SARS-CoV-2 public health emergency.  Safety protocols were in place, including screening questions prior to the visit, additional usage of staff PPE, and extensive cleaning of exam room while observing appropriate contact time as indicated for disinfecting solutions.

## 2019-07-02 ENCOUNTER — Encounter: Payer: Self-pay | Admitting: Family Medicine

## 2019-07-02 ENCOUNTER — Telehealth (INDEPENDENT_AMBULATORY_CARE_PROVIDER_SITE_OTHER): Payer: BC Managed Care – PPO | Admitting: Family Medicine

## 2019-07-02 DIAGNOSIS — J302 Other seasonal allergic rhinitis: Secondary | ICD-10-CM

## 2019-07-02 MED ORDER — AZELASTINE HCL 0.1 % NA SOLN: 2.0000 | Freq: Two times a day (BID) | NASAL | 12 refills | Status: DC

## 2019-07-02 MED ORDER — AMOXICILLIN-POT CLAVULANATE 875-125 MG PO TABS: 1.0000 | ORAL_TABLET | Freq: Two times a day (BID) | ORAL | 0 refills | Status: DC

## 2019-07-02 NOTE — Progress Notes (Signed)
   Carmen Simon is a 49 y.o. female who presents today for a virtual office visit.  Assessment/Plan:  New/Acute Problems: Sinusitis No red flags.  Start Astelin nasal spray.  Also send in "pocket prescription" for Augmentin with strict instruction not start with symptoms worsen or do not improve in the next several days.  Encourage good oral hydration.  She can continue using over-the-counter meds as needed.  Follow-up as needed.  Chronic Problems Addressed Today: Allergic Rhinitis Worsened.  Likely main contributor for above sinusitis.  She will continue Zyrtec and Singulair.  Will add on Astelin as noted above.    Subjective:  HPI:  Symptoms started yesterday with irrigated throat.  As day progressed, her symptoms started to worsen.  Has had sore throat, lethargy, malaise, cough, facial pressure. Feels like a post nasal drip. No fevers or chills. Feels similar to past sinus infections.  This morning she tried taking mucinex with no signficant improvement.        Objective/Observations  Physical Exam: Gen: NAD, resting comfortably Pulm: Normal work of breathing Neuro: Grossly normal, moves all extremities Psych: Normal affect and thought content  Virtual Visit via Video   I connected with Carmen Simon on 07/02/19 at 11:40 AM EDT by a video enabled telemedicine application and verified that I am speaking with the correct person using two identifiers. The limitations of evaluation and management by telemedicine and the availability of in person appointments were discussed. The patient expressed understanding and agreed to proceed.   Patient location: Home Provider location: [place] participating in the virtual visit: Myself and Patient     Algis Greenhouse. Jerline Pain, MD 07/02/2019 11:48 AM

## 2019-10-25 ENCOUNTER — Ambulatory Visit: Payer: BC Managed Care – PPO | Admitting: Plastic Surgery

## 2019-10-25 ENCOUNTER — Other Ambulatory Visit: Payer: Self-pay

## 2019-10-25 ENCOUNTER — Encounter: Payer: Self-pay | Admitting: Plastic Surgery

## 2019-10-25 VITALS — BP 138/79 | HR 75 | Temp 98.5°F | Ht 64.0 in | Wt 201.2 lb

## 2019-10-25 DIAGNOSIS — D229 Melanocytic nevi, unspecified: Secondary | ICD-10-CM | POA: Diagnosis not present

## 2019-10-25 NOTE — Progress Notes (Signed)
Referring Provider Leamon Arnt, [address],  [place] [address]   CC:  Chief Complaint  Patient presents with  . Consult      Carmen Simon is an 49 y.o. female.  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  Patient is not quite sure how long the lesion has been there but it was ultimately biopsied revealing moderate to severe atypia.  She sent to me for excision and closure.  Allergies  Allergen Reactions  . Aspirin Tinitus  . Gluten Meal Swelling    "retains water'  . Erythromycin Other (See Comments)    Stomach cramps    Outpatient Encounter Medications as of 10/25/2019  Medication Sig  . acetaminophen (TYLENOL) 325 MG tablet Take 650 mg by mouth every 6 (six) hours as needed for moderate pain.  . ALBUTEROL IN Inhale 2 puffs into the lungs as needed.   Marland Kitchen amoxicillin-clavulanate (AUGMENTIN) 875-125 MG tablet Take 1 tablet by mouth 2 (two) times daily.  Marland Kitchen azelastine (ASTELIN) 0.1 % nasal spray Place 2 sprays into both nostrils 2 (two) times daily.  . cetirizine (ZYRTEC) 10 MG tablet Take 10 mg by mouth every morning.   . Cholecalciferol (VITAMIN D3) 2000 UNITS TABS Take 1 tablet by mouth daily.   . furosemide (LASIX) 20 MG tablet Take 1 tablet (20 mg total) by mouth daily as needed for fluid or edema. Take with potassium  . levonorgestrel (MIRENA) 20 MCG/24HR IUD 1 each by Intrauterine route once. Sees GYN, IUD Expires in 2021  . montelukast (SINGULAIR) 10 MG tablet Take 10 mg by mouth at bedtime.  Marland Kitchen OVER THE COUNTER MEDICATION Take 1 capsule by mouth daily. 5HTP  . potassium chloride SA (KLOR-CON) 20 MEQ tablet Take 1 tablet (20 mEq total) by mouth daily as needed. Take with Lasix  . Probiotic Product (PROBIOTIC DAILY PO) Take 1 tablet by mouth daily. Cuterelle  . pyridOXINE (B-6) 50 MG tablet Take by mouth.  . vitamin B-12 (CYANOCOBALAMIN) 500 MCG tablet Take 500 mcg by mouth daily.   No  facility-administered encounter medications on file as of 10/25/2019.     Past Medical History:  Diagnosis Date  . Asthma, cough variant   . Elevated LFTs 05/21/2018  . GERD (gastroesophageal reflux disease)    OCCASIONAL-- TAKE APPLE CIDER VINAGER  . Gluten intolerance    "causes retained water"  . History of HPV infection   . PONV (postoperative nausea and vomiting)   . Seasonal allergies   . Ulnar abutment syndrome of right wrist 05/05/2016    Past Surgical History:  Procedure Laterality Date  . CESAREAN SECTION  12-28-2000  . CHOLECYSTECTOMY  2004  . D & C HYSTEROSOCPY W/ POLYPECTOMY  09/ 2000  . GYNECOLOGIC CRYOSURGERY  1992  . INTRAUTERINE DEVICE INSERTION  2008   MIRENA  . OSTEOTOMY AND ULNAR SHORTENING Right 2017  . TRANSTHORACIC ECHOCARDIOGRAM  10-27-2010   normal echo/  ef 65%  . ULNAR TUNNEL RELEASE Left 05/ 2014   LEFT ELBOW -- REVISION 11/ 2015    Family History  Problem Relation Age of Onset  . Stroke Mother   . Diabetes Father     Social History   Social History Narrative   Two levels with family   R handed   Masters in speech therapy   Working   Caffeine - one cup coffee/day ; rare soda   Exercise - some  Review of Systems General: Denies fevers, chills, weight loss CV: Denies chest pain, shortness of breath, palpitations  Physical Exam Vitals with BMI 10/25/2019 06/19/2019 12/18/2018  Height 5\' 4"  5\' 4"  5\' 4"   Weight 201 lbs 3 oz 195 lbs 13 oz 191 lbs 10 oz  BMI 34.52 30.85 69.43  Systolic 700 525 910  Diastolic 79 82 83  Pulse 75 78 99    General:  No acute distress,  Alert and oriented, Non-Toxic, Normal speech and affect Right ear: There is a well-defined biopsy site in a circular lesion that is probably less than a centimeter in size in the right antihelix area.  No surrounding skin changes that are obvious to me.  There is no scarring in the right neck.  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  I explained the need for excision to remove the atypical cells.  I explained this would leave a defect after margin was taken and then would likely require skin graft for coverage.  I explained this would look like a slightly different color but would overall maintain the shape and structure of her ear.  She is in agreement we will plan to get this scheduled for her.  Risks and benefits were discussed.  Cindra Presume 10/25/2019, 12:04 PM

## 2019-12-12 ENCOUNTER — Other Ambulatory Visit: Payer: Self-pay

## 2019-12-12 ENCOUNTER — Ambulatory Visit: Payer: BC Managed Care – PPO | Admitting: Plastic Surgery

## 2019-12-12 ENCOUNTER — Encounter: Payer: Self-pay | Admitting: Plastic Surgery

## 2019-12-12 ENCOUNTER — Other Ambulatory Visit (HOSPITAL_COMMUNITY)
Admission: RE | Admit: 2019-12-12 | Discharge: 2019-12-12 | Disposition: A | Discharge status: Home / Self Care | Payer: BC Managed Care – PPO | Source: Ambulatory Visit | Attending: Plastic Surgery | Admitting: Plastic Surgery

## 2019-12-12 VITALS — BP 126/81 | HR 76 | Temp 98.0°F

## 2019-12-12 DIAGNOSIS — D229 Melanocytic nevi, unspecified: Secondary | ICD-10-CM | POA: Diagnosis present

## 2019-12-12 NOTE — Addendum Note (Signed)
Addended by: Cindra Presume on: 12/12/2019 01:00 PM   Modules accepted: Orders

## 2019-12-12 NOTE — Progress Notes (Signed)
Operative Note   DATE OF OPERATION: 12/12/2019  LOCATION:    SURGICAL DEPARTMENT: Plastic Surgery  PREOPERATIVE DIAGNOSES: Atypical nevus right ear  POSTOPERATIVE DIAGNOSES:  same  PROCEDURE:  1. Excision of atypical nevus right ear measuring 1.5 cm 2. Full-thickness skin graft to the right ear measuring 1.5 x 1.5 cm  SURGEON: Talmadge Coventry, MD  ANESTHESIA:  Local  COMPLICATIONS: None.   INDICATIONS FOR PROCEDURE:  The patient, Carmen Simon is a 49 y.o. female born on January 14, 1971, is here for treatment of severely atypical nevus right ear MRN: [id number]  CONSENT:  Informed consent was obtained directly from the patient. Risks, benefits and alternatives were fully discussed. Specific risks including but not limited to bleeding, infection, hematoma, seroma, scarring, pain, infection, wound healing problems, and need for further surgery were all discussed. The patient did have an ample opportunity to have questions answered to satisfaction.   DESCRIPTION OF PROCEDURE:  Local anesthesia was administered. The patient's operative site was prepped and draped in a sterile fashion. A time out was performed and all information was confirmed to be correct.  The lesion was excised with a 15 blade.  Hemostasis was obtained.  I then harvested a full-thickness skin graft from the right posterior auricular area and a corresponding sized.  This was inset with chromic sutures.  The donor site was closed with Monocryl sutures.  A bolster was fashioned with Xeroform and nylon suture. The patient tolerated the procedure well.  There were no complications.

## 2019-12-14 ENCOUNTER — Telehealth: Payer: Self-pay

## 2019-12-14 NOTE — Telephone Encounter (Signed)
Called and spoke with the patient regarding her message below, and informed her that I spoke with Dr. Claudia Desanctis and he said no concerns.  Patient verbalized understanding and agreed.//AB/CMA

## 2019-12-14 NOTE — Telephone Encounter (Signed)
Patient called to let us know that she had a skin graft on her ear on Wednesday and when she woke up this morning, it had bled overnight.  She would like to know if she should be concerned or is this normal.  Please call.

## 2019-12-17 LAB — SURGICAL PATHOLOGY

## 2019-12-19 ENCOUNTER — Encounter: Payer: Self-pay | Admitting: Plastic Surgery

## 2019-12-19 ENCOUNTER — Other Ambulatory Visit: Payer: Self-pay

## 2019-12-19 ENCOUNTER — Ambulatory Visit (INDEPENDENT_AMBULATORY_CARE_PROVIDER_SITE_OTHER): Payer: BC Managed Care – PPO | Admitting: Plastic Surgery

## 2019-12-19 VITALS — BP 123/87 | HR 77 | Temp 98.2°F

## 2019-12-19 DIAGNOSIS — D229 Melanocytic nevi, unspecified: Secondary | ICD-10-CM

## 2019-12-19 NOTE — Progress Notes (Signed)
Patient is here 1 week postop from excision of atypical nevus of the ear and closure of the full-thickness skin graft.  Pathology showed no residual nevus.  Bolster was removed today skin graft looks to be taking nicely.  Donor site is healing fine.  We will plan to see her again in a few weeks.  All her questions were answered.

## 2019-12-20 ENCOUNTER — Encounter: Payer: Self-pay | Admitting: Family Medicine

## 2019-12-20 ENCOUNTER — Ambulatory Visit: Payer: BC Managed Care – PPO | Admitting: Family Medicine

## 2019-12-20 VITALS — BP 124/78 | HR 76 | Temp 98.0°F | Ht 64.0 in | Wt 203.8 lb

## 2019-12-20 DIAGNOSIS — R7303 Prediabetes: Secondary | ICD-10-CM | POA: Diagnosis not present

## 2019-12-20 DIAGNOSIS — Z1159 Encounter for screening for other viral diseases: Secondary | ICD-10-CM

## 2019-12-20 DIAGNOSIS — Z23 Encounter for immunization: Secondary | ICD-10-CM

## 2019-12-20 DIAGNOSIS — R600 Localized edema: Secondary | ICD-10-CM | POA: Diagnosis not present

## 2019-12-20 LAB — POCT GLYCOSYLATED HEMOGLOBIN (HGB A1C): Hemoglobin A1C: 5.6 % (ref 4.0–5.6)

## 2019-12-20 NOTE — Progress Notes (Signed)
Subjective  CC:  Chief Complaint  Patient presents with  . abnormal fasting glucose  . Leg Swelling    bilateral   . Health Maintenance    flu shot given in office today    HPI: Carmen Simon is a 49 y.o. female who presents to the office today for follow up of diabetes and problems listed above in the chief complaint.   Prediabetes follow-up: Continues to feel well. Eats mostly nonprocessed from diet. Weight fluctuates. Continues to have stress. [employer] both parents. Busy lifestyle. No symptoms of hypoglycemia.  Bilateral lower extremity edema: We changed from [facility] to Lasix back in April. She does not feel that it has made a significant difference. She has chronic mild lower extremity edema but also complains of swelling throughout her body at different times. She tries eat a low-salt diet. She does report remote history of hyponatremia that is since resolved, unclear diagnosis. She denies chest pain or shortness of breath. No calf pain. Labs from April showed a normal sodium and normal potassium. Normal renal function.  Health maintenance: Due for flu vaccine and hep C screen.   Wt Readings from Last 3 Encounters:  12/20/19 203 lb 12.8 oz (92.4 kg)  10/25/19 201 lb 3.2 oz (91.3 kg)  06/19/19 195 lb 12.8 oz (88.8 kg)    BP Readings from Last 3 Encounters:  12/20/19 124/78  12/19/19 123/87  12/12/19 126/81    Assessment  1. Prediabetes   2. Bilateral lower extremity edema   3. Need for hepatitis C screening test      Plan   History of prediabetes: Recheck A1c today. Hopefully with diet this is normalized. Monitor annually.  Bilateral lower extremity edema: Increase Lasix to 40 mg daily for 3 to 5 days. Recheck potassium sodium and renal function. May need 40 daily to control symptoms. Suspect dependent edema of both venous insufficiency. No evidence of renal, hepatic or cardiac etiology. Check electrolytes  Screen hep C and flu shot today.  Follow up: 6  months for complete physical. Orders Placed This Encounter  Procedures  . Flu Vaccine QUAD 6+ mos PF IM (Fluarix Quad PF)  . Basic metabolic panel  . Hemoglobin A1c  . Hepatitis C antibody  . POCT HgB A1C   No orders of the defined types were placed in this encounter.     Immunization History  Administered Date(s) Administered  . Influenza,inj,Quad PF,6+ Mos 11/22/2018  . Influenza-Unspecified 11/28/2017  . PFIZER SARS-COV-2 Vaccination 03/30/2019, 04/20/2019  . Tdap 05/17/2018    Diabetes Related Lab Review: Lab Results  Component Value Date   HGBA1C 5.6 12/20/2019   HGBA1C 5.8 (A) 06/19/2019   HGBA1C 6.0 11/22/2018    No results found for: Derl Barrow Lab Results  Component Value Date   CREATININE 0.68 06/19/2019   BUN 17 06/19/2019   NA 140 06/19/2019   K 4.4 06/19/2019   CL 102 06/19/2019   CO2 28 06/19/2019   Lab Results  Component Value Date   CHOL 187 06/19/2019   CHOL 208 (H) 11/22/2018   CHOL 185 02/15/2018   Lab Results  Component Value Date   HDL 46.30 06/19/2019   HDL 51.30 11/22/2018   HDL 48.70 02/15/2018   Lab Results  Component Value Date   LDLCALC 121 (H) 06/19/2019   LDLCALC 139 (H) 11/22/2018   LDLCALC 117 (H) 02/15/2018   Lab Results  Component Value Date   TRIG 103.0 06/19/2019   TRIG 87.0 11/22/2018  TRIG 95.0 02/15/2018   Lab Results  Component Value Date   CHOLHDL 4 06/19/2019   CHOLHDL 4 11/22/2018   CHOLHDL 4 02/15/2018   No results found for: LDLDIRECT The 10-year ASCVD risk score Mikey Bussing DC Jr., et al., 2013) is: 1.2%   Values used to calculate the score:     Age: 71 years     Sex: Female     Is Non-Hispanic African American: No     Diabetic: No     Tobacco smoker: No     Systolic Blood Pressure: 341 mmHg     Is BP treated: No     HDL Cholesterol: 46.3 mg/dL     Total Cholesterol: 187 mg/dL I have reviewed the PMH, Fam and Soc history. Patient Active Problem List   Diagnosis Date Noted  . Obesity  (BMI 30-39.9) 06/19/2019    Priority: High  . Prediabetes 06/19/2019    Priority: High  . Bilateral lower extremity edema 05/21/2018    Priority: Medium  . Gluten intolerance     Priority: Medium  . GERD (gastroesophageal reflux disease)     Priority: Medium  . Asthma, cough variant     Priority: Medium  . Seasonal allergies     Priority: Low    Social History: Patient  reports that she has never smoked. She has never used smokeless tobacco. She reports current alcohol use of about 1.0 standard drink of alcohol per week. She reports that she does not use drugs.  Review of Systems: Ophthalmic: negative for eye pain, loss of vision or double vision Cardiovascular: negative for chest pain Respiratory: negative for SOB or persistent cough Gastrointestinal: negative for abdominal pain Genitourinary: negative for dysuria or gross hematuria MSK: negative for foot lesions Neurologic: negative for weakness or gait disturbance  Objective  Vitals: BP 124/78   Pulse 76   Temp 98 F (36.7 C) (Temporal)   Ht 5\' 4"  (1.626 m)   Wt 203 lb 12.8 oz (92.4 kg)   SpO2 98%   BMI 34.98 kg/m  General: well appearing, no acute distress  Psych:  Alert and oriented, normal mood and affect Extremity: 1+ pitting edema bilaterally to mid shin   Diabetic education: ongoing education regarding chronic disease management for diabetes was given today. We continue to reinforce the ABC's of diabetic management: A1c (<7 or 8 dependent upon patient), tight blood pressure control, and cholesterol management with goal LDL < 100 minimally. We discuss diet strategies, exercise recommendations, medication options and possible side effects. At each visit, we review recommended immunizations and preventive care recommendations for diabetics and stress that good diabetic control can prevent other problems. See below for this patient's data.    Commons side effects, risks, benefits, and alternatives for medications  and treatment plan prescribed today were discussed, and the patient expressed understanding of the given instructions. Patient is instructed to call or message via MyChart if he/she has any questions or concerns regarding our treatment plan. No barriers to understanding were identified. We discussed Red Flag symptoms and signs in detail. Patient expressed understanding regarding what to do in case of urgent or emergency type symptoms.   Medication list was reconciled, printed and provided to the patient in AVS. Patient instructions and summary information was reviewed with the patient as documented in the AVS. This note was prepared with assistance of Dragon voice recognition software. Occasional wrong-word or sound-a-like substitutions may have occurred due to the inherent limitations of voice recognition software  This visit  occurred during the SARS-CoV-2 public health emergency.  Safety protocols were in place, including screening questions prior to the visit, additional usage of staff PPE, and extensive cleaning of exam room while observing appropriate contact time as indicated for disinfecting solutions.

## 2019-12-20 NOTE — Patient Instructions (Signed)
Please return in 6 months for your annual complete physical; please come fasting.  I will release your lab results to you on your MyChart account with further instructions. Please reply with any questions.  I will recommend increasing your lasix dose short term to see if we can get your swelling better controlled. I'll let you know what to do with your potassium.  We may need a higher dose chronically; trial and error is how we will know. Keep daily weight log when we are adjusting your dosing. Your sodium was normal in April. I recommend a low salt diet at this time.   If you have any questions or concerns, please don't hesitate to send me a message via MyChart or call the office at [phone]. Thank you for visiting with [place] today! It's our pleasure caring for you.

## 2019-12-21 LAB — BASIC METABOLIC PANEL
BUN: 11 mg/dL (ref 7–25)
CO2: 28 mmol/L (ref 20–32)
Calcium: 9.5 mg/dL (ref 8.6–10.2)
Chloride: 104 mmol/L (ref 98–110)
Creat: 0.65 mg/dL (ref 0.50–1.10)
Glucose, Bld: 107 mg/dL — ABNORMAL HIGH (ref 65–99)
Potassium: 4.9 mmol/L (ref 3.5–5.3)
Sodium: 140 mmol/L (ref 135–146)

## 2019-12-21 LAB — HEPATITIS C ANTIBODY
Hepatitis C Ab: NONREACTIVE
SIGNAL TO CUT-OFF: 0.01 (ref <1.00)

## 2019-12-21 LAB — HEMOGLOBIN A1C
Hgb A1c MFr Bld: 5.8 % of total Hgb — ABNORMAL HIGH (ref <5.7)
Mean Plasma Glucose: 120 (calc)
eAG (mmol/L): 6.6 (calc)

## 2020-01-08 NOTE — Progress Notes (Signed)
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 Claudia Desanctis.  Pathology showed no residual nevus.  ~ 4 weeks PO Patient reports she is doing very well.  No concerns.  Denies fever/chills, nausea/vomiting.  Skin graft on right ear appears to be taking well; cap refill present.  Donor site incision behind right ear is healing very nicely.  All incisions are intact, clean and dry.  No signs of infection or drainage.  Suture knots were removed today.  Follow-up as needed.  Call office with any questions/concerns.

## 2020-01-09 ENCOUNTER — Other Ambulatory Visit: Payer: Self-pay

## 2020-01-09 ENCOUNTER — Ambulatory Visit (INDEPENDENT_AMBULATORY_CARE_PROVIDER_SITE_OTHER): Payer: BC Managed Care – PPO | Admitting: Plastic Surgery

## 2020-01-09 ENCOUNTER — Encounter: Payer: Self-pay | Admitting: Plastic Surgery

## 2020-01-09 VITALS — BP 132/76 | HR 80 | Temp 98.4°F

## 2020-01-09 DIAGNOSIS — D229 Melanocytic nevi, unspecified: Secondary | ICD-10-CM

## 2020-03-21 ENCOUNTER — Encounter: Payer: Self-pay | Admitting: Family Medicine

## 2020-03-24 MED ORDER — ALBUTEROL SULFATE HFA 108 (90 BASE) MCG/ACT IN AERS: 2.0000 | INHALATION_SPRAY | Freq: Four times a day (QID) | RESPIRATORY_TRACT | 1 refills | Status: DC | PRN

## 2020-04-09 ENCOUNTER — Encounter: Payer: Self-pay | Admitting: Family Medicine

## 2020-04-14 ENCOUNTER — Ambulatory Visit
Admission: RE | Admit: 2020-04-14 | Discharge: 2020-04-14 | Disposition: A | Discharge status: Home / Self Care | Payer: BC Managed Care – PPO | Source: Ambulatory Visit | Attending: Family Medicine | Admitting: Family Medicine

## 2020-04-14 ENCOUNTER — Ambulatory Visit (INDEPENDENT_AMBULATORY_CARE_PROVIDER_SITE_OTHER): Payer: BC Managed Care – PPO

## 2020-04-14 ENCOUNTER — Other Ambulatory Visit: Payer: Self-pay | Admitting: Family Medicine

## 2020-04-14 ENCOUNTER — Encounter: Payer: Self-pay | Admitting: Family Medicine

## 2020-04-14 ENCOUNTER — Other Ambulatory Visit: Payer: Self-pay

## 2020-04-14 ENCOUNTER — Ambulatory Visit (INDEPENDENT_AMBULATORY_CARE_PROVIDER_SITE_OTHER): Payer: BC Managed Care – PPO | Admitting: Family Medicine

## 2020-04-14 VITALS — BP 130/90 | HR 101 | Temp 97.8°F | Ht 64.0 in | Wt 204.8 lb

## 2020-04-14 DIAGNOSIS — W108XXA Fall (on) (from) other stairs and steps, initial encounter: Secondary | ICD-10-CM

## 2020-04-14 DIAGNOSIS — S20222A Contusion of left back wall of thorax, initial encounter: Secondary | ICD-10-CM

## 2020-04-14 DIAGNOSIS — M549 Dorsalgia, unspecified: Secondary | ICD-10-CM

## 2020-04-14 MED ORDER — CYCLOBENZAPRINE HCL 10 MG PO TABS: 10.0000 mg | ORAL_TABLET | Freq: Three times a day (TID) | ORAL | 0 refills | Status: DC | PRN

## 2020-04-14 MED ORDER — HYDROCODONE-ACETAMINOPHEN 5-325 MG PO TABS
1.0000 | ORAL_TABLET | Freq: Four times a day (QID) | ORAL | 0 refills | Status: DC | PRN
Start: 2020-04-14 — End: 2020-06-19

## 2020-04-14 MED ORDER — DICLOFENAC SODIUM 75 MG PO TBEC: 75.0000 mg | DELAYED_RELEASE_TABLET | Freq: Two times a day (BID) | ORAL | 0 refills | Status: DC

## 2020-04-14 MED ORDER — KETOROLAC TROMETHAMINE 60 MG/2ML IM SOLN
60.0000 mg | Freq: Once | INTRAMUSCULAR | Status: AC
Start: 2020-04-14 — End: 2020-04-14
  Administered 2020-04-14: 60 mg via INTRAMUSCULAR

## 2020-04-14 NOTE — Patient Instructions (Addendum)
Please follow up if symptoms do not improve or as needed.   Use heat/ice to back. Use medications as prescribed.  I will let you know what the xrays show.

## 2020-04-14 NOTE — Progress Notes (Signed)
Subjective  CC:  Chief Complaint  Patient presents with  . Back Injury    7am this morning. Patient fell this morning on her steps .     HPI: Carmen Simon is a 50 y.o. female who presents to the office today to address the problems listed above in the chief complaint.  Patient slipped on icy brick steps this morning when leaving her house.  Unfortunately, her foot slipped out in front of her and she landed firmly directly on a brick step, mid back.  She fell so hard she was winded.  Neighbors had to come help her.  She lied there for several moments because she was not sure she could get up.  With assistance, she is able to get back to the home.  Since, she has progressive worsening of mid and upper back pain with stiffness and pain with moving.  She is more comfortable standing.  Hard to sit down or get up from a seated position.  She is taking 800 mg of ibuprofen with only minimal relief.  She denies other injuries.  No radicular symptoms.  She has no chronic back problems.  Date of injury: 04/14/2020 Assessment  1. Acute midline back pain, unspecified back location   2. Fall on steps, initial encounter   3. Contusion, back, left, initial encounter      Plan   Fall with acute back pain: Moderate to severe pain: Check x-rays to rule out fractures.  Significant musculoskeletal pain with muscle spasms.  Will start pain medicine, muscle relaxer and await x-rays.  Discussed symptoms can worsen before they improve.  No other injuries identified.  Follow up: As needed 06/19/2020  Orders Placed This Encounter  Procedures  . DG Lumbar Spine Complete  . DG Thoracic Spine 4V   No orders of the defined types were placed in this encounter.     I reviewed the patients updated PMH, FH, and SocHx.    Patient Active Problem List   Diagnosis Date Noted  . Obesity (BMI 30-39.9) 06/19/2019    Priority: High  . Prediabetes 06/19/2019    Priority: High  . Bilateral lower extremity edema  05/21/2018    Priority: Medium  . Gluten intolerance     Priority: Medium  . GERD (gastroesophageal reflux disease)     Priority: Medium  . Asthma, cough variant     Priority: Medium  . Seasonal allergies     Priority: Low   Current Meds  Medication Sig  . albuterol (VENTOLIN HFA) 108 (90 Base) MCG/ACT inhaler Inhale 2 puffs into the lungs every 6 (six) hours as needed for wheezing or shortness of breath.  . cetirizine (ZYRTEC) 10 MG tablet Take 10 mg by mouth every morning.   . Cholecalciferol (VITAMIN D3) 2000 UNITS TABS Take 1 tablet by mouth daily.   . furosemide (LASIX) 20 MG tablet Take 1 tablet (20 mg total) by mouth daily as needed for fluid or edema. Take with potassium  . levonorgestrel (MIRENA) 20 MCG/24HR IUD 1 each by Intrauterine route once. Sees GYN, IUD Expires in 2021  . montelukast (SINGULAIR) 10 MG tablet Take 10 mg by mouth at bedtime.  Marland Kitchen OVER THE COUNTER MEDICATION Take 1 capsule by mouth daily. 5HTP  . potassium chloride SA (KLOR-CON) 20 MEQ tablet Take 1 tablet (20 mEq total) by mouth daily as needed. Take with Lasix    Allergies: Patient is allergic to aspirin, gluten meal, and erythromycin. Family History: Patient family history  includes Diabetes in her father; Stroke in her mother. Social History:  Patient  reports that she has never smoked. She has never used smokeless tobacco. She reports current alcohol use of about 1.0 standard drink of alcohol per week. She reports that she does not use drugs.  Review of Systems: Constitutional: Negative for fever malaise or anorexia Cardiovascular: negative for chest pain Respiratory: negative for SOB or persistent cough Gastrointestinal: negative for abdominal pain  Objective  Vitals: BP 130/90   Pulse (!) 101   Temp 97.8 F (36.6 C) (Temporal)   Ht 5\' 4"  (1.626 m)   Wt 204 lb 12.8 oz (92.9 kg)   SpO2 98%   BMI 35.15 kg/m  General: Standing, uncomfortable, diaphoretic  Cardiovascular:  RRR without murmur  or gallop.  Respiratory:  Good breath sounds bilaterally, CTAB with normal respiratory effort Back: No spinal tenderness or step-offs, contusion and abrasion noted over left mid back; paravertebral muscle tenderness present.  Very limited range of motion due to pain and spasm.  Antalgic gait  Toradol 60 mg IM given to patient with good relief/improvement in pain.   Commons side effects, risks, benefits, and alternatives for medications and treatment plan prescribed today were discussed, and the patient expressed understanding of the given instructions. Patient is instructed to call or message via MyChart if he/she has any questions or concerns regarding our treatment plan. No barriers to understanding were identified. We discussed Red Flag symptoms and signs in detail. Patient expressed understanding regarding what to do in case of urgent or emergency type symptoms.   Medication list was reconciled, printed and provided to the patient in AVS. Patient instructions and summary information was reviewed with the patient as documented in the AVS. This note was prepared with assistance of Dragon voice recognition software. Occasional wrong-word or sound-a-like substitutions may have occurred due to the inherent limitations of voice recognition software  This visit occurred during the SARS-CoV-2 public health emergency.  Safety protocols were in place, including screening questions prior to the visit, additional usage of staff PPE, and extensive cleaning of exam room while observing appropriate contact time as indicated for disinfecting solutions.

## 2020-06-13 ENCOUNTER — Other Ambulatory Visit: Payer: Self-pay | Admitting: Family Medicine

## 2020-06-19 ENCOUNTER — Encounter: Payer: Self-pay | Admitting: Family Medicine

## 2020-06-19 ENCOUNTER — Other Ambulatory Visit: Payer: Self-pay

## 2020-06-19 ENCOUNTER — Ambulatory Visit (INDEPENDENT_AMBULATORY_CARE_PROVIDER_SITE_OTHER): Payer: BC Managed Care – PPO | Admitting: Family Medicine

## 2020-06-19 VITALS — BP 136/82 | HR 74 | Temp 97.8°F | Ht 63.5 in | Wt 208.8 lb

## 2020-06-19 DIAGNOSIS — R600 Localized edema: Secondary | ICD-10-CM

## 2020-06-19 DIAGNOSIS — Z7185 Encounter for immunization safety counseling: Secondary | ICD-10-CM | POA: Diagnosis not present

## 2020-06-19 DIAGNOSIS — Z1211 Encounter for screening for malignant neoplasm of colon: Secondary | ICD-10-CM | POA: Diagnosis not present

## 2020-06-19 DIAGNOSIS — Z0000 Encounter for general adult medical examination without abnormal findings: Secondary | ICD-10-CM

## 2020-06-19 DIAGNOSIS — K9041 Non-celiac gluten sensitivity: Secondary | ICD-10-CM

## 2020-06-19 DIAGNOSIS — R7303 Prediabetes: Secondary | ICD-10-CM | POA: Diagnosis not present

## 2020-06-19 DIAGNOSIS — J302 Other seasonal allergic rhinitis: Secondary | ICD-10-CM

## 2020-06-19 DIAGNOSIS — J45991 Cough variant asthma: Secondary | ICD-10-CM

## 2020-06-19 DIAGNOSIS — Z1212 Encounter for screening for malignant neoplasm of rectum: Secondary | ICD-10-CM

## 2020-06-19 DIAGNOSIS — E669 Obesity, unspecified: Secondary | ICD-10-CM | POA: Diagnosis not present

## 2020-06-19 DIAGNOSIS — K219 Gastro-esophageal reflux disease without esophagitis: Secondary | ICD-10-CM

## 2020-06-19 LAB — LIPID PANEL
Cholesterol: 160 mg/dL (ref 0–200)
HDL: 44.7 mg/dL (ref >39.00)
LDL Cholesterol: 102 mg/dL — ABNORMAL HIGH (ref 0–99)
NonHDL: 115.23
Total CHOL/HDL Ratio: 4
Triglycerides: 66 mg/dL (ref 0.0–149.0)
VLDL: 13.2 mg/dL (ref 0.0–40.0)

## 2020-06-19 LAB — CBC WITH DIFFERENTIAL/PLATELET
Basophils Absolute: 0 10*3/uL (ref 0.0–0.1)
Basophils Relative: 0.6 % (ref 0.0–3.0)
Eosinophils Absolute: 0.2 10*3/uL (ref 0.0–0.7)
Eosinophils Relative: 3.2 % (ref 0.0–5.0)
HCT: 39.7 % (ref 36.0–46.0)
Hemoglobin: 13 g/dL (ref 12.0–15.0)
Lymphocytes Relative: 31.2 % (ref 12.0–46.0)
Lymphs Abs: 2.4 10*3/uL (ref 0.7–4.0)
MCHC: 32.8 g/dL (ref 30.0–36.0)
MCV: 87.6 fl (ref 78.0–100.0)
Monocytes Absolute: 0.5 10*3/uL (ref 0.1–1.0)
Monocytes Relative: 5.9 % (ref 3.0–12.0)
Neutro Abs: 4.5 10*3/uL (ref 1.4–7.7)
Neutrophils Relative %: 59.1 % (ref 43.0–77.0)
Platelets: 272 10*3/uL (ref 150.0–400.0)
RBC: 4.53 Mil/uL (ref 3.87–5.11)
RDW: 14.3 % (ref 11.5–15.5)
WBC: 7.6 10*3/uL (ref 4.0–10.5)

## 2020-06-19 LAB — COMPREHENSIVE METABOLIC PANEL
ALT: 30 U/L (ref 0–35)
AST: 17 U/L (ref 0–37)
Albumin: 4.1 g/dL (ref 3.5–5.2)
Alkaline Phosphatase: 74 U/L (ref 39–117)
BUN: 13 mg/dL (ref 6–23)
CO2: 30 mEq/L (ref 19–32)
Calcium: 9.8 mg/dL (ref 8.4–10.5)
Chloride: 105 mEq/L (ref 96–112)
Creatinine, Ser: 0.72 mg/dL (ref 0.40–1.20)
GFR: 97.7 mL/min (ref >60.00)
Glucose, Bld: 98 mg/dL (ref 70–99)
Potassium: 5.2 mEq/L — ABNORMAL HIGH (ref 3.5–5.1)
Sodium: 141 mEq/L (ref 135–145)
Total Bilirubin: 0.4 mg/dL (ref 0.2–1.2)
Total Protein: 6.5 g/dL (ref 6.0–8.3)

## 2020-06-19 LAB — HEMOGLOBIN A1C: Hgb A1c MFr Bld: 6 % (ref 4.6–6.5)

## 2020-06-19 MED ORDER — FUROSEMIDE 20 MG PO TABS: 40.0000 mg | ORAL_TABLET | Freq: Two times a day (BID) | ORAL | 3 refills | Status: DC | PRN

## 2020-06-19 MED ORDER — POTASSIUM CHLORIDE CRYS ER 20 MEQ PO TBCR: 20.0000 meq | EXTENDED_RELEASE_TABLET | Freq: Two times a day (BID) | ORAL | 3 refills | Status: AC | PRN

## 2020-06-19 MED ORDER — ALBUTEROL SULFATE 1.25 MG/3ML IN NEBU: 1.0000 | INHALATION_SOLUTION | Freq: Four times a day (QID) | RESPIRATORY_TRACT | 3 refills | Status: DC | PRN

## 2020-06-19 NOTE — Progress Notes (Signed)
Subjective  Chief Complaint  Patient presents with  . Annual Exam    Fasting   . Asthma    Needing refills on neb solution     HPI: Carmen Simon is a 50 y.o. female who presents to [facility] today for a Female Wellness Visit.  She also has the concerns and/or needs as listed above in the chief complaint. These will be addressed in addition to the Health Maintenance Visit.   Wellness Visit: annual visit with health maintenance review and exam without Pap   Health maintenance: Sees gynecology for female wellness.  Overall, she is doing well in spite of multiple stressors.  Her twins are both struggling in college with mood disorders.  She is coping well.  Denies symptoms of depression or anxiety herself.  Work is busy.  Diet is fair.  Little exercise.  She is eligible for colorectal cancer screening.  Elects colonoscopy.  Eligible for Shingrix Chronic disease management visit and/or acute problem visit:  Cough variant asthma, triggered by allergies: Currently active.  Has seen an allergist in the past.  Typically does well if she uses albuterol nebulizers.  She has been on a maintenance inhaler in the past but says it was not useful.  She takes Singulair which is very helpful.  No symptoms today but over the last week has needed nebulizer treatment 4 times.  No symptoms of infection.  Would like a refill.  History prediabetes: No symptoms of hyperglycemia.  Monitoring.  Gluten intolerance with systemic inflammation and lower extremity edema.  Monitors and watches her diet closely.  Mild lower extremity edema: Taking Lasix 40 mg daily with 20 mEq of potassium.  Has mildly improved her lower extremity edema.  Still with pitting edema.  Reviewed echocardiogram from 2020 showing normal ventricular function.  No diastolic dysfunction.  No valvular abnormalities.  Kidney and liver function have been normal.  Thyroid has been normal.  GERD: Not currently  active.  Assessment  1. Annual physical exam   2. Asthma, cough variant   3. Prediabetes   4. Obesity (BMI 30-39.9)   5. Gluten intolerance   6. Gastroesophageal reflux disease without esophagitis   7. Seasonal allergies   8. Screening for colorectal cancer   9. Vaccine counseling   10. Bilateral lower extremity edema      Plan  Female Wellness Visit:  Age appropriate Health Maintenance and Prevention measures were discussed with patient. Included topics are cancer screening recommendations, ways to keep healthy (see AVS) including dietary and exercise recommendations, regular eye and dental care, use of seat belts, and avoidance of moderate alcohol use and tobacco use.  Counseled on colon cancer screening options.  Colonoscopy referral placed other screenings are up-to-date  BMI: discussed patient's BMI and encouraged positive lifestyle modifications to help get to or maintain a target BMI.  HM needs and immunizations were addressed and ordered. See below for orders. See HM and immunization section for updates.  Counseled on vaccines, Shingrix specifically.  Patient to schedule nurse visits.  Routine labs and screening tests ordered including cmp, cbc and lipids where appropriate.  Discussed recommendations regarding Vit D and calcium supplementation (see AVS)  Chronic disease f/u and/or acute problem visit: (deemed necessary to be done in addition to the wellness visit):  Asthma cough variant: Education and counseling done.  We will need to monitor to see if she needs any maintenance medication.  For now refilled albuterol neb.  Offered allergy referral.  Patient will monitor and let me know if she needs more help.  For now, albuterol as needed, goal use less than 2 times weekly.  Prediabetes: Monitoring diet.  Recheck A1c today.  Clinically stable  Obesity: Work on diet and weight loss discussed.  GERD and gluten intolerance: Both managed by diet currently.  Seasonal  allergies on antihistamines and Singulair  Lower extremity edema: Possibly related to systemic inflammation or venous insufficiency.  Increase Lasix to 40 mg twice daily with potassium supplementation.  Follow up: 12 months for your complete annual physical exam with blood work. Please come fasting.  Orders Placed This Encounter  Procedures  . CBC with Differential/Platelet  . Comprehensive metabolic panel  . Lipid panel  . Hemoglobin A1c  . Ambulatory referral to Gastroenterology   Meds ordered this encounter  Medications  . furosemide (LASIX) 20 MG tablet    Sig: Take 2 tablets (40 mg total) by mouth 2 (two) times daily as needed for edema.    Dispense:  180 tablet    Refill:  3  . potassium chloride SA (KLOR-CON M20) 20 MEQ tablet    Sig: Take 1 tablet (20 mEq total) by mouth 2 (two) times daily as needed. Take with lasix    Dispense:  90 tablet    Refill:  3  . albuterol (ACCUNEB) 1.25 MG/3ML nebulizer solution    Sig: Take 3 mLs (1.25 mg total) by nebulization every 6 (six) hours as needed for wheezing.    Dispense:  90 mL    Refill:  3     Outpatient Encounter Medications as of 06/19/2020  Medication Sig  . albuterol (VENTOLIN HFA) 108 (90 Base) MCG/ACT inhaler Inhale 2 puffs into the lungs every 6 (six) hours as needed for wheezing or shortness of breath.  . cetirizine (ZYRTEC) 10 MG tablet Take 10 mg by mouth every morning.   . Cholecalciferol (VITAMIN D3) 2000 UNITS TABS Take 1 tablet by mouth daily.   Marland Kitchen levonorgestrel (MIRENA) 20 MCG/24HR IUD 1 each by Intrauterine route once. Sees GYN, IUD Expires in 2021  . montelukast (SINGULAIR) 10 MG tablet Take 10 mg by mouth at bedtime.  Marland Kitchen OVER THE COUNTER MEDICATION Take 1 capsule by mouth daily. 5HTP  . Probiotic Product (PROBIOTIC DAILY PO) Take 1 tablet by mouth daily. Cuterelle  . vitamin B-12 (CYANOCOBALAMIN) 500 MCG tablet Take 500 mcg by mouth daily.  . [DISCONTINUED] albuterol (ACCUNEB) 1.25 MG/3ML nebulizer solution  Take 1 ampule by nebulization every 6 (six) hours as needed for wheezing.  . [DISCONTINUED] furosemide (LASIX) 20 MG tablet TAKE 1 TABLET BY MOUTH DAILY AS NEEDED FOR FLUID OR EDEMA. TAKE WITH POTASSIUM (Patient taking differently: 40 mg.)  . [DISCONTINUED] KLOR-CON M20 20 MEQ tablet TAKE 1 TABLET BY MOUTH DAILY AS NEEDED. TAKE WITH LASIX  . albuterol (ACCUNEB) 1.25 MG/3ML nebulizer solution Take 3 mLs (1.25 mg total) by nebulization every 6 (six) hours as needed for wheezing.  . furosemide (LASIX) 20 MG tablet Take 2 tablets (40 mg total) by mouth 2 (two) times daily as needed for edema.  . potassium chloride SA (KLOR-CON M20) 20 MEQ tablet Take 1 tablet (20 mEq total) by mouth 2 (two) times daily as needed. Take with lasix  . [DISCONTINUED] cyclobenzaprine (FLEXERIL) 10 MG tablet Take 1 tablet (10 mg total) by mouth 3 (three) times daily as needed for muscle spasms. (Patient not taking: Reported on 06/19/2020)  . [DISCONTINUED] diclofenac (VOLTAREN) 75 MG EC tablet Take 1 tablet (75  mg total) by mouth 2 (two) times daily. (Patient not taking: Reported on 06/19/2020)  . [DISCONTINUED] HYDROcodone-acetaminophen (NORCO) 5-325 MG tablet Take 1 tablet by mouth every 6 (six) hours as needed for moderate pain. (Patient not taking: Reported on 06/19/2020)   No facility-administered encounter medications on file as of 06/19/2020.     Body mass index is 36.41 kg/m. Wt Readings from Last 3 Encounters:  06/19/20 208 lb 12.8 oz (94.7 kg)  04/14/20 204 lb 12.8 oz (92.9 kg)  12/20/19 203 lb 12.8 oz (92.4 kg)   Need for contraception: No,   Patient Active Problem List   Diagnosis Date Noted  . Obesity (BMI 30-39.9) 06/19/2019    Priority: High  . Prediabetes 06/19/2019    Priority: High  . Bilateral lower extremity edema 05/21/2018    Priority: Medium    Normal renal, liver function. Normal Echocardiogram 2020. No diastolic dysfunction   . Gluten intolerance     Priority: Medium  . GERD  (gastroesophageal reflux disease)     Priority: Medium  . Asthma, cough variant     Priority: Medium  . Seasonal allergies     Priority: Low   Health Maintenance  Topic Date Due  . COLONOSCOPY (Pts 45-42yrs Insurance coverage will need to be confirmed)  Never done  . COVID-19 Vaccine (4 - Booster for Pfizer series) 07/15/2020  . MAMMOGRAM  08/06/2020  . INFLUENZA VACCINE  10/06/2020  . PAP SMEAR-Modifier  06/02/2022  . TETANUS/TDAP  05/16/2028  . Hepatitis C Screening  Completed  . HIV Screening  Completed  . HPV VACCINES  Aged Out   Immunization History  Administered Date(s) Administered  . Influenza,inj,Quad PF,6+ Mos 11/22/2018, 12/20/2019  . Influenza-Unspecified 11/28/2017  . PFIZER(Purple Top)SARS-COV-2 Vaccination 03/30/2019, 04/20/2019, 01/16/2020  . Tdap 05/17/2018   We updated and reviewed the patient's past history in detail and it is documented below. Allergies: Patient  reports current alcohol use of about 1.0 standard drink of alcohol per week. 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 Past Surgical History Patient  has a past surgical history that includes transthoracic echocardiogram (10-27-2010); Cesarean section (12-28-2000); D & C HYSTEROSOCPY W/ POLYPECTOMY (09/ 2000); Gynecologic cryosurgery (1992); Ulnar tunnel release (Left, 05/ 2014); Intrauterine device insertion (2008); Osteotomy and ulnar shortening (Right, 2017); and Cholecystectomy (2004). Social History   Socioeconomic History  . Marital status: Married    Spouse name: Not on file  . Number of children: Not on file  . Years of education: Not on file  . Highest education level: Not on file  Occupational History  . Occupation:  Astronomer    Employer: [employer]  Tobacco Use  . Smoking status: Never Smoker  . Smokeless tobacco: Never Used  Vaping Use  . Vaping Use: Never used  Substance and Sexual Activity  . Alcohol use: Yes    Alcohol/week: 1.0 standard drink    Types: 1 Glasses of wine per week  . Drug use: No  . Sexual activity: Yes    Birth control/protection: I.U.D.    Comment: Mirena  Other Topics Concern  . Not on file  Social History Narrative   Two levels with family   R handed   Masters in speech therapy   Working   Caffeine - one cup coffee/day ;  rare soda   Exercise - some   Social Determinants of Health   Financial Resource Strain: Not on file  Food Insecurity: Not on file  Transportation Needs: Not on file  Physical Activity: Not on file  Stress: Not on file  Social Connections: Not on file   Family History  Problem Relation Age of Onset  . Stroke Mother   . Diabetes Father     Review of Systems: Constitutional: negative for fever or malaise Ophthalmic: negative for photophobia, double vision or loss of vision Cardiovascular: negative for chest pain, dyspnea on exertion, or new LE swelling Respiratory: negative for SOB or persistent cough Gastrointestinal: negative for abdominal pain, change in bowel habits or melena Genitourinary: negative for dysuria or gross hematuria, no abnormal uterine bleeding or disharge Musculoskeletal: negative for new gait disturbance or muscular weakness Integumentary: negative for new or persistent rashes, no breast lumps Neurological: negative for TIA or stroke symptoms Psychiatric: negative for SI or delusions Allergic/Immunologic: negative for hives  Patient Care Team    Relationship Specialty Notifications Start End  Leamon Arnt, MD PCP - General Family Medicine  06/19/19   Pieter Partridge, DO Consulting Physician Neurology  12/18/18   Leamon Arnt, MD Consulting Physician Family Medicine  06/19/19   Brien Few, MD Consulting Physician Obstetrics and Gynecology  06/19/19     Objective  Vitals: BP 136/82   Pulse 74   Temp 97.8 F (36.6 C) (Temporal)   Ht 5' 3.5" (1.613 m)   Wt 208 lb 12.8 oz (94.7 kg)   SpO2 98%   BMI 36.41 kg/m  General:  Well developed, well nourished, no acute distress  Psych:  Alert and orientedx3,normal mood and affect HEENT:  Normocephalic, atraumatic, non-icteric sclera, PERRL, supple neck without adenopathy, mass or thyromegaly Cardiovascular:  Normal S1, S2, RRR without gallop, rub or murmur Respiratory:  Good breath sounds bilaterally, CTAB with normal respiratory effort Gastrointestinal: normal bowel sounds, soft, non-tender, no noted masses. No HSM MSK: no deformities, contusions. Joints are without erythema or swelling.  Skin:  Warm, no rashes or suspicious lesions noted Extremity: +2 pitting edema to mid shin Neurologic:    Mental status is normal. Gross motor and sensory exams are normal. Normal gait. No tremor    Commons side effects, risks, benefits, and alternatives for medications and treatment plan prescribed today were discussed, and the patient expressed understanding of the given instructions. Patient is instructed to call or message via MyChart if he/she has any questions or concerns regarding our treatment plan. No barriers to understanding were identified. We discussed Red Flag symptoms and signs in detail. Patient expressed understanding regarding what to do in case of urgent or emergency type symptoms.   Medication list was reconciled, printed and provided to the patient in AVS. Patient instructions and summary information was reviewed with the patient as documented in the AVS. This note was prepared with assistance of Dragon voice recognition software. Occasional wrong-word or sound-a-like substitutions may have occurred due to the inherent limitations of voice recognition software  This visit occurred during the SARS-CoV-2 public health  emergency.  Safety protocols were in place, including screening questions prior to the visit, additional usage of staff PPE, and extensive cleaning of exam room while observing appropriate contact time as indicated for disinfecting solutions.

## 2020-06-19 NOTE — Patient Instructions (Signed)
Please return in 12 months for your annual complete physical; please come fasting. Please schedule nurse visits for your shingrix vaccinations, one next week and then another in 2-6 months.  I will release your lab results to you on your MyChart account with further instructions. Please reply with any questions.   If you have any questions or concerns, please don't hesitate to send me a message via MyChart or call the office at [phone]. Thank you for visiting with Carmen Simon today! It's our pleasure caring for you.  Please do these things to maintain good health!   Exercise at least 30-45 minutes a day,  4-5 days a week.   Eat a low-fat diet with lots of fruits and vegetables, up to 7-9 servings per day.  Drink plenty of water daily. Try to drink 8 8oz glasses per day.  Seatbelts can save your life. Always wear your seatbelt.  Place Smoke Detectors on every level of your home and check batteries every year.  Schedule an appointment with an eye doctor for an eye exam every 1-2 years  Avoid heavy alcohol use. If you drink, keep it to less than 2 drinks/day and not every day.  [place].  Choose someone you trust that could speak for you if you became unable to speak for yourself.  Depression is common in our stressful world.If you're feeling down or losing interest in things you normally enjoy, please come in for a visit.  If anyone is threatening or hurting you, please get help. Physical or Emotional Violence is never OK.

## 2020-06-25 ENCOUNTER — Other Ambulatory Visit: Payer: Self-pay

## 2020-06-25 MED ORDER — FUROSEMIDE 40 MG PO TABS: 40.0000 mg | ORAL_TABLET | Freq: Two times a day (BID) | ORAL | 3 refills | Status: DC

## 2020-06-26 ENCOUNTER — Other Ambulatory Visit: Payer: Self-pay

## 2020-06-26 ENCOUNTER — Encounter: Payer: Self-pay | Admitting: *Deleted

## 2020-06-26 ENCOUNTER — Encounter: Payer: Self-pay | Admitting: Gastroenterology

## 2020-06-26 ENCOUNTER — Ambulatory Visit (INDEPENDENT_AMBULATORY_CARE_PROVIDER_SITE_OTHER): Payer: BC Managed Care – PPO | Admitting: *Deleted

## 2020-06-26 DIAGNOSIS — Z23 Encounter for immunization: Secondary | ICD-10-CM

## 2020-06-26 NOTE — Progress Notes (Signed)
Per orders of Dr. Jerline Pain, injection of Shingrix 0.5 ml, IM given by Anselmo Pickler, LPN in left deltoid. Patient tolerated injection well. Patient will make appointment for 2-6 months for 2 nd Shingrix injection. Pt verbalized understanding.

## 2020-08-27 ENCOUNTER — Other Ambulatory Visit: Payer: Self-pay

## 2020-08-27 ENCOUNTER — Ambulatory Visit (AMBULATORY_SURGERY_CENTER): Payer: BC Managed Care – PPO

## 2020-08-27 ENCOUNTER — Encounter: Payer: Self-pay | Admitting: Gastroenterology

## 2020-08-27 VITALS — Ht 63.5 in | Wt 210.0 lb

## 2020-08-27 DIAGNOSIS — Z1211 Encounter for screening for malignant neoplasm of colon: Secondary | ICD-10-CM

## 2020-08-27 NOTE — Progress Notes (Signed)
No allergies to soy or egg Pt is not on blood thinners or diet pills Denies issues with sedation/intubation Denies atrial flutter/fib Denies constipation   Emmi instructions given to pt  Pt is aware of Covid safety and care partner requirements.   Has not had propofol before but with tradition anesthesia's she states she wakes crying and nauseated

## 2020-09-09 ENCOUNTER — Ambulatory Visit: Payer: BC Managed Care – PPO

## 2020-09-10 ENCOUNTER — Ambulatory Visit (AMBULATORY_SURGERY_CENTER): Payer: BC Managed Care – PPO | Admitting: Gastroenterology

## 2020-09-10 ENCOUNTER — Encounter: Payer: Self-pay | Admitting: Gastroenterology

## 2020-09-10 ENCOUNTER — Other Ambulatory Visit: Payer: Self-pay

## 2020-09-10 VITALS — BP 125/73 | HR 67 | Temp 96.6°F | Resp 16 | Ht 63.0 in | Wt 210.0 lb

## 2020-09-10 DIAGNOSIS — Z1211 Encounter for screening for malignant neoplasm of colon: Secondary | ICD-10-CM

## 2020-09-10 MED ORDER — SODIUM CHLORIDE 0.9 % IV SOLN
500.0000 mL | INTRAVENOUS | Status: DC
Start: 2020-09-10 — End: 2020-09-10

## 2020-09-10 NOTE — Op Note (Signed)
[facility] Patient Name: Carmen Simon Procedure Date: 09/10/2020 8:38 AM MRN: [id number] Endoscopist: Justice Britain , MD Age: 50 Referring MD:  Date of Birth: 29-Jul-1970 Gender: Female Account #: [id number] Procedure:                Colonoscopy Indications:              Screening for colorectal malignant neoplasm, This                            is the patient's first colonoscopy Medicines:                Monitored Anesthesia Care Procedure:                Pre-Anesthesia Assessment:                           - Prior to the procedure, a History and Physical                            was performed, and patient medications and                            allergies were reviewed. The patient's tolerance of                            previous anesthesia was also reviewed. The risks                            and benefits of the procedure and the sedation                            options and risks were discussed with the patient.                            All questions were answered, and informed consent                            was obtained. Prior Anticoagulants: The patient has                            taken no previous anticoagulant or antiplatelet                            agents. ASA Grade Assessment: II - A patient with                            mild systemic disease. After reviewing the risks                            and benefits, the patient was deemed in                            satisfactory condition to undergo the procedure.  After obtaining informed consent, the colonoscope                            was passed under direct vision. Throughout the                            procedure, the patient's blood pressure, pulse, and                            oxygen saturations were monitored continuously. The                            Olympus CF-HQ190L [id number]) Colonoscope was                            introduced through the anus  and advanced to the the                            cecum, identified by appendiceal orifice and                            ileocecal valve. The colonoscopy was performed                            without difficulty. The patient tolerated the                            procedure. The quality of the bowel preparation was                            adequate. The ileocecal valve, appendiceal orifice,                            and rectum were photographed. Scope In: 8:57:21 AM Scope Out: 9:13:37 AM Scope Withdrawal Time: 0 hours 9 minutes 20 seconds  Total Procedure Duration: 0 hours 16 minutes 16 seconds  Findings:                 Skin tags were found on perianal exam.                           The digital rectal exam was normal. Pertinent                            negatives include no palpable rectal lesions.                           The colon (entire examined portion) revealed                            moderately excessive looping. Advancing the scope                            required changing the patient's position, using  manual pressure, straightening and shortening the                            scope to obtain bowel loop reduction and using                            scope torsion.                           Normal mucosa was found in the entire colon.                           Non-bleeding non-thrombosed internal hemorrhoids                            were found during retroflexion, during perianal                            exam and during digital exam. The hemorrhoids were                            Grade I (internal hemorrhoids that do not prolapse). Complications:            No immediate complications. Estimated Blood Loss:     Estimated blood loss: none. Impression:               - Perianal skin tags found on perianal exam.                           - There was significant looping of the colon.                           - Normal mucosa in the  entire examined colon.                           - Non-bleeding non-thrombosed internal hemorrhoids. Recommendation:           - The patient will be observed post-procedure,                            until all discharge criteria are met.                           - Discharge patient to home.                           - Patient has a contact number available for                            emergencies. The signs and symptoms of potential                            delayed complications were discussed with the                            patient. Return to normal  activities tomorrow.                            Written discharge instructions were provided to the                            patient.                           - High fiber diet.                           - Use FiberCon 1-2 tablets PO daily.                           - Continue present medications.                           - Repeat colonoscopy in 10 years for screening                            purposes.                           - The findings and recommendations were discussed                            with the patient.                           - The findings and recommendations were discussed                            with the patient's family. Justice Britain, MD 09/10/2020 9:18:53 AM

## 2020-09-10 NOTE — Patient Instructions (Signed)
Handouts provided on hemorrhoids.  Recommend a High-fiber diet (see handout).  Use FiberCon 1-2 tablets by mouth daily.   Repeat colonoscopy in 10 years for screening purposes.   YOU HAD AN ENDOSCOPIC PROCEDURE TODAY AT [facility]:   Refer to the procedure report that was given to you for any specific questions about what was found during the examination.  If the procedure report does not answer your questions, please call your gastroenterologist to clarify.  If you requested that your care partner not be given the details of your procedure findings, then the procedure report has been included in a sealed envelope for you to review at your convenience later.  YOU SHOULD EXPECT: Some feelings of bloating in the abdomen. Passage of more gas than usual.  Walking can help get rid of the air that was put into your GI tract during the procedure and reduce the bloating. If you had a lower endoscopy (such as a colonoscopy or flexible sigmoidoscopy) you may notice spotting of blood in your stool or on the toilet paper. If you underwent a bowel prep for your procedure, you may not have a normal bowel movement for a few days.  Please Note:  You might notice some irritation and congestion in your nose or some drainage.  This is from the oxygen used during your procedure.  There is no need for concern and it should clear up in a day or so.  SYMPTOMS TO REPORT IMMEDIATELY:  Following lower endoscopy (colonoscopy or flexible sigmoidoscopy):  Excessive amounts of blood in the stool  Significant tenderness or worsening of abdominal pains  Swelling of the abdomen that is new, acute  Fever of 100F or higher  For urgent or emergent issues, a gastroenterologist can be reached at any hour by calling [phone]. Do not use MyChart messaging for urgent concerns.    DIET:  We do recommend a small meal at first, but then you may proceed to your regular diet.  Drink plenty of fluids but you  should avoid alcoholic beverages for 24 hours.  ACTIVITY:  You should plan to take it easy for the rest of today and you should NOT DRIVE or use heavy machinery until tomorrow (because of the sedation medicines used during the test).    FOLLOW UP: Our staff will call the number listed on your records 48-72 hours following your procedure to check on you and address any questions or concerns that you may have regarding the information given to you following your procedure. If we do not reach you, we will leave a message.  We will attempt to reach you two times.  During this call, we will ask if you have developed any symptoms of COVID 19. If you develop any symptoms (ie: fever, flu-like symptoms, shortness of breath, cough etc.) before then, please call [phone].  If you test positive for Covid 19 in the 2 weeks post procedure, please call and report this information to [place].    If any biopsies were taken you will be contacted by phone or by letter within the next 1-3 weeks.  Please call us at [phone] if you have not heard about the biopsies in 3 weeks.    SIGNATURES/CONFIDENTIALITY: You and/or your care partner have signed paperwork which will be entered into your electronic medical record.  These signatures attest to the fact that that the information above on your After Visit Summary has been reviewed and is understood.  Full responsibility of the  confidentiality of this discharge information lies with you and/or your care-partner.

## 2020-09-10 NOTE — Progress Notes (Signed)
A/ox3, pleased with MAC, report to RN 

## 2020-09-10 NOTE — Progress Notes (Signed)
Vs VV  I have reviewed the patient's medical history in detail and updated the computerized patient record.   

## 2020-09-12 ENCOUNTER — Telehealth: Payer: Self-pay

## 2020-09-12 NOTE — Telephone Encounter (Signed)
  Follow up Call-  Call back number 09/10/2020  Post procedure Call Back phone  # [phone]  Permission to leave phone message Yes  Some recent data might be hidden     Patient questions:  Do you have a fever, pain , or abdominal swelling? No. Pain Score  0 *  Have you tolerated food without any problems? Yes.    Have you been able to return to your normal activities? Yes.    Do you have any questions about your discharge instructions: Diet   No. Medications  No. Follow up visit  No.  Do you have questions or concerns about your Care? No.  Actions: * If pain score is 4 or above: No action needed, pain <4.

## 2020-09-16 ENCOUNTER — Other Ambulatory Visit: Payer: Self-pay

## 2020-09-16 ENCOUNTER — Ambulatory Visit (INDEPENDENT_AMBULATORY_CARE_PROVIDER_SITE_OTHER): Payer: BC Managed Care – PPO

## 2020-09-16 DIAGNOSIS — Z23 Encounter for immunization: Secondary | ICD-10-CM

## 2020-12-30 ENCOUNTER — Other Ambulatory Visit: Payer: Self-pay | Admitting: Family Medicine

## 2021-01-16 ENCOUNTER — Telehealth (INDEPENDENT_AMBULATORY_CARE_PROVIDER_SITE_OTHER): Payer: BC Managed Care – PPO | Admitting: Family

## 2021-01-16 ENCOUNTER — Encounter: Payer: Self-pay | Admitting: Family

## 2021-01-16 VITALS — Ht 63.0 in | Wt 210.1 lb

## 2021-01-16 DIAGNOSIS — J4521 Mild intermittent asthma with (acute) exacerbation: Secondary | ICD-10-CM

## 2021-01-16 DIAGNOSIS — J019 Acute sinusitis, unspecified: Secondary | ICD-10-CM | POA: Diagnosis not present

## 2021-01-16 MED ORDER — AMOXICILLIN-POT CLAVULANATE 875-125 MG PO TABS: 1.0000 | ORAL_TABLET | Freq: Two times a day (BID) | ORAL | 0 refills | Status: DC

## 2021-01-16 NOTE — Progress Notes (Signed)
MyChart Video Visit    Virtual Visit via Video Note   This visit type was conducted due to national recommendations for restrictions regarding the COVID-19 Pandemic (e.g. social distancing) in an effort to limit this patient's exposure and mitigate transmission in our community. This patient is at least at moderate risk for complications without adequate follow up. This format is felt to be most appropriate for this patient at this time. Physical exam was limited by quality of the video and audio technology used for the visit. CMA was able to get the patient set up on a video visit.  Patient location: Home. Patient and provider in visit Provider location: Office  I discussed the limitations of evaluation and management by telemedicine and the availability of in person appointments. The patient expressed understanding and agreed to proceed.  Visit Date: 01/16/2021  Today's healthcare provider: Jeanie Sewer, NP     Subjective:    Patient ID: Carmen Simon, female    DOB: August 19, 1970, 50 y.o.   MRN: [id number]  Chief Complaint  Patient presents with   Nasal Congestion    Symptoms started early Monday. Covid, Strep, Flu-Negative at [facility].  Dayquil, Nyquil, Mucinex.   Sore Throat   Cough   Generalized Body Aches   Wheezing   Eye Drainage   Fatigue   Covid Positive    Follow up; Positive last week in September.     HPI Upper Respiratory Infection: Symptoms include chest pleuritic pain, chest congestion, nasal congestion, sore throat, and productive cough .  Onset of symptoms was 6 days ago, unchanged since that time. She is drinking moderate amounts of fluids. Evaluation to date: seen previously and thought to have a viral URI, neg tests for flu, strep, and Covid. Treatment to date:  seen by UC, advised to take Mucinex  , pt also taking dayquil, nyquil, did not want to take Azelastin nasal spray.  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    Past Surgical History:  Procedure Laterality Date   CESAREAN SECTION  12/28/2000   CHOLECYSTECTOMY  2004   COLONOSCOPY     had colonoscopy about 10 yrs ago and pt states was normal   D & C HYSTEROSOCPY W/ POLYPECTOMY  11/1998   GYNECOLOGIC CRYOSURGERY  1992   INTRAUTERINE DEVICE INSERTION  2008   MIRENA   OSTEOTOMY AND ULNAR SHORTENING Right 2017   TRANSTHORACIC ECHOCARDIOGRAM  10/27/2010   normal echo/  ef 65%   ULNAR TUNNEL RELEASE Left 07/2012   LEFT ELBOW -- REVISION 11/ 2015    Outpatient Medications Prior to Visit  Medication Sig Dispense Refill   albuterol (ACCUNEB) 1.25 MG/3ML nebulizer solution Take 3 mLs (1.25 mg total) by nebulization every 6 (six) hours as needed for wheezing. 90 mL 3   albuterol (VENTOLIN HFA) 108 (90 Base) MCG/ACT inhaler Inhale 2 puffs into the lungs every 6 (six) hours as needed for wheezing or shortness of breath. 1 each 1   azelastine (ASTELIN) 0.1 % nasal spray 2 sprays each nostril BID over next 3-5 days for post nasal drip  cetirizine (ZYRTEC) 10 MG tablet Take 10 mg by mouth every morning.      Cholecalciferol (VITAMIN D3) 2000 UNITS TABS Take 1 tablet by mouth daily.      furosemide (LASIX) 40 MG tablet TAKE 1 TABLET BY MOUTH TWICE A DAY 180 tablet 1   levonorgestrel (MIRENA) 20 MCG/24HR IUD 1 each by Intrauterine route once. Sees GYN, IUD Expires in 2021     lidocaine (XYLOCAINE) 2 % solution Mix 2 parts maalox, 1 part benadryl, and 1 part viscous lidocaine. Gargle and spit 86ml every 4 hours as needed.     montelukast (SINGULAIR) 10 MG tablet Take 10 mg by mouth at bedtime.     OVER THE COUNTER MEDICATION Take 1 capsule by mouth  daily. 5HTP     potassium chloride SA (KLOR-CON M20) 20 MEQ tablet Take 1 tablet (20 mEq total) by mouth 2 (two) times daily as needed. Take with lasix 90 tablet 3   Probiotic Product (PROBIOTIC DAILY PO) Take 1 tablet by mouth daily. Cuterelle     vitamin B-12 (CYANOCOBALAMIN) 500 MCG tablet Take 500 mcg by mouth daily.     No facility-administered medications prior to visit.    Allergies  Allergen Reactions   Aspirin Tinitus   Gluten Meal Swelling    "retains water'   Erythromycin Other (See Comments)    Stomach cramps        Objective:     Physical Exam Vitals and nursing note reviewed.  Constitutional:      General: She is not in acute distress.    Appearance: Normal appearance.  HENT:     Head: Normocephalic.  Pulmonary:     Effort: No respiratory distress.  Musculoskeletal:     Cervical back: Normal range of motion.  Skin:    General: Skin is dry.     Coloration: Skin is not pale.  Neurological:     Mental Status: She is alert and oriented to person, place, and time.  Psychiatric:        Mood and Affect: Mood normal.   Ht 5\' 3"  (1.6 m)   Wt 210 lb 1.6 oz (95.3 kg)   BMI 37.22 kg/m   Wt Readings from Last 3 Encounters:  01/16/21 210 lb 1.6 oz (95.3 kg)  09/10/20 210 lb (95.3 kg)  08/27/20 210 lb (95.3 kg)       Assessment & Plan:   Problem List Items Addressed This Visit       Respiratory   Mild intermittent asthma with exacerbation - Primary    Pt having exacerbation now due to current URI, reports continuous coughing, using nebulizer bid, advised increasing to tid, increase water intake to 64oz qd. Reports she can't tolerated prednisone, she developed an allergic reaction, gained 20lbs, and sent her into a "gluten intolerance state". Sending Augmentin (erythromycin allergy), advised on use & SE, pt told to call back Monday if no better, may need steroid inhaler.      Relevant Medications   amoxicillin-clavulanate (AUGMENTIN) 875-125 MG tablet    Acute non-recurrent sinusitis    Did not want to use the Azelastin nasal spray. Advised to try generic Sudafed OTC, use as directed. Increase water intake to 64oz qd.      Relevant Medications   azelastine (ASTELIN) 0.1 % nasal spray   amoxicillin-clavulanate (AUGMENTIN) 875-125 MG tablet    Meds ordered this encounter  Medications   amoxicillin-clavulanate (AUGMENTIN) 875-125 MG tablet    Sig: Take 1 tablet by mouth 2 (  two) times daily with a meal.    Dispense:  10 tablet    Refill:  0    Order Specific Question:   Supervising Provider    Answer:   ANDY, CAMILLE L [[id number]]     I discussed the assessment and treatment plan with the patient. The patient was provided an opportunity to ask questions and all were answered. The patient agreed with the plan and demonstrated an understanding of the instructions.   The patient was advised to call back or seek an in-person evaluation if the symptoms worsen or if the condition fails to improve as anticipated.  I provided 22 minutes of face-to-face time during this encounter.   Jeanie Sewer, NP [place] [phone] (phone) [phone] (fax)  [place]

## 2021-01-16 NOTE — Assessment & Plan Note (Signed)
Did not want to use the Azelastin nasal spray. Advised to try generic Sudafed OTC, use as directed. Increase water intake to 64oz qd.

## 2021-01-16 NOTE — Assessment & Plan Note (Addendum)
Pt having exacerbation now due to current URI, reports continuous coughing, using nebulizer bid, advised increasing to tid, increase water intake to 64oz qd. Reports she can't tolerated prednisone, she developed an allergic reaction, gained 20lbs, and sent her into a "gluten intolerance state". Sending Augmentin (erythromycin allergy), advised on use & SE, pt told to call back Monday if no better, may need steroid inhaler.

## 2021-02-03 ENCOUNTER — Encounter: Payer: Self-pay | Admitting: Family

## 2021-02-03 ENCOUNTER — Other Ambulatory Visit: Payer: Self-pay

## 2021-02-03 ENCOUNTER — Ambulatory Visit (INDEPENDENT_AMBULATORY_CARE_PROVIDER_SITE_OTHER): Payer: BC Managed Care – PPO | Admitting: Family

## 2021-02-03 VITALS — BP 177/81 | HR 76 | Temp 98.3°F | Ht 63.0 in | Wt 215.6 lb

## 2021-02-03 DIAGNOSIS — J020 Streptococcal pharyngitis: Secondary | ICD-10-CM | POA: Insufficient documentation

## 2021-02-03 LAB — POCT RAPID STREP A (OFFICE): Rapid Strep A Screen: POSITIVE — AB

## 2021-02-03 MED ORDER — AMOXICILLIN 500 MG PO CAPS: 500.0000 mg | ORAL_CAPSULE | Freq: Two times a day (BID) | ORAL | 0 refills | Status: AC

## 2021-02-03 NOTE — Progress Notes (Signed)
Subjective:     Patient ID: Carmen Simon, female    DOB: 10/01/1970, 50 y.o.   MRN: [id number]  Chief Complaint  Patient presents with   Sore Throat    Severe, but denies fever.   Nasal Congestion    Pt states she got better with ABX, but a couple weeks later became more ill.     Sore Throat   Sore Throat: Patient complains of sore throat. Associated symptoms include nasal blockage, post nasal drip, sinus and nasal congestion, and sore throat.Onset of symptoms was 2 weeks ago, gradually worsening since that time. She is drinking moderate amounts of fluids. She has not had recent close exposure to someone with proven streptococcal pharyngitis.   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    Past Surgical History:  Procedure Laterality Date   CESAREAN SECTION  12/28/2000   CHOLECYSTECTOMY  2004   COLONOSCOPY     had colonoscopy about 10 yrs ago and pt states was normal   D & C HYSTEROSOCPY W/ POLYPECTOMY  11/1998   GYNECOLOGIC CRYOSURGERY  1992   INTRAUTERINE DEVICE INSERTION  2008   MIRENA   OSTEOTOMY AND ULNAR SHORTENING Right 2017   TRANSTHORACIC ECHOCARDIOGRAM  10/27/2010   normal echo/  ef 65%   ULNAR TUNNEL RELEASE Left 07/2012   LEFT ELBOW -- REVISION 11/ 2015    Outpatient Medications Prior to Visit  Medication Sig Dispense Refill   albuterol (ACCUNEB) 1.25 MG/3ML nebulizer solution Take 3 mLs (1.25 mg total) by nebulization every 6 (six) hours as needed for wheezing. 90 mL 3   albuterol (VENTOLIN HFA) 108 (90 Base) MCG/ACT inhaler Inhale 2 puffs into the  lungs every 6 (six) hours as needed for wheezing or shortness of breath. 1 each 1   cetirizine (ZYRTEC) 10 MG tablet Take 10 mg by mouth every morning.      Cholecalciferol (VITAMIN D3) 2000 UNITS TABS Take 1 tablet by mouth daily.      furosemide (LASIX) 40 MG tablet TAKE 1 TABLET BY MOUTH TWICE A DAY 180 tablet 1   levonorgestrel (MIRENA) 20 MCG/24HR IUD 1 each by Intrauterine route once. Sees GYN, IUD Expires in 2021     lidocaine (XYLOCAINE) 2 % solution Mix 2 parts maalox, 1 part benadryl, and 1 part viscous lidocaine. Gargle and spit 18ml every 4 hours as needed.     montelukast (SINGULAIR) 10 MG tablet Take 10 mg by mouth at bedtime.     OVER THE COUNTER MEDICATION Take 1 capsule by mouth daily. 5HTP     potassium chloride SA (KLOR-CON M20) 20 MEQ tablet Take 1 tablet (20 mEq total) by mouth 2 (two) times daily as needed. Take with lasix 90 tablet 3   Probiotic Product (PROBIOTIC DAILY PO) Take 1 tablet by mouth daily. Cuterelle     vitamin B-12 (CYANOCOBALAMIN) 500 MCG tablet Take 500 mcg by mouth daily.     azelastine (ASTELIN) 0.1 % nasal spray 2 sprays each nostril BID over next 3-5  days for post nasal drip     amoxicillin-clavulanate (AUGMENTIN) 875-125 MG tablet Take 1 tablet by mouth 2 (two) times daily with a meal. 10 tablet 0   No facility-administered medications prior to visit.    Allergies  Allergen Reactions   Aspirin Tinitus   Gluten Meal Swelling    "retains water'   Erythromycin Other (See Comments)    Stomach cramps        Objective:    Physical Exam Vitals and nursing note reviewed.  Constitutional:      Appearance: Normal appearance.  HENT:     Right Ear: Tympanic membrane and ear canal normal.     Left Ear: Tympanic membrane and ear canal normal.     Nose:     Right Sinus: No frontal sinus tenderness.     Left Sinus: No frontal sinus tenderness.     Mouth/Throat:     Mouth: Mucous membranes are moist.     Pharynx: Posterior oropharyngeal erythema  present. No pharyngeal swelling or oropharyngeal exudate.     Tonsils: No tonsillar exudate or tonsillar abscesses.  Cardiovascular:     Rate and Rhythm: Normal rate and regular rhythm.  Pulmonary:     Effort: Pulmonary effort is normal.     Breath sounds: Normal breath sounds.  Musculoskeletal:        General: Normal range of motion.  Lymphadenopathy:     Cervical: Cervical adenopathy present.  Skin:    General: Skin is warm and dry.  Neurological:     Mental Status: She is alert.  Psychiatric:        Mood and Affect: Mood normal.        Behavior: Behavior normal.    BP (!) 177/81   Pulse 76   Temp 98.3 F (36.8 C) (Temporal)   Ht 5\' 3"  (1.6 m)   Wt 215 lb 9.6 oz (97.8 kg)   SpO2 97%   BMI 38.19 kg/m  Wt Readings from Last 3 Encounters:  02/03/21 215 lb 9.6 oz (97.8 kg)  01/16/21 210 lb 1.6 oz (95.3 kg)  09/10/20 210 lb (95.3 kg)       Assessment & Plan:   Problem List Items Addressed This Visit       Respiratory   Strep throat - Primary   Relevant Orders   POCT rapid strep A (Completed)    Meds ordered this encounter  Medications   amoxicillin (AMOXIL) 500 MG capsule    Sig: Take 1 capsule (500 mg total) by mouth every 12 (twelve) hours for 10 days.    Dispense:  20 capsule    Refill:  0    Order Specific Question:   Supervising Provider    Answer:   ANDY, CAMILLE L [[id number]]

## 2021-06-22 ENCOUNTER — Encounter: Payer: BC Managed Care – PPO | Admitting: Family Medicine

## 2021-10-14 ENCOUNTER — Encounter (INDEPENDENT_AMBULATORY_CARE_PROVIDER_SITE_OTHER): Payer: Self-pay

## 2021-11-30 ENCOUNTER — Encounter: Payer: Self-pay | Admitting: *Deleted

## 2022-02-18 ENCOUNTER — Encounter: Payer: Self-pay | Admitting: *Deleted

## 2022-03-19 IMAGING — DX DG LUMBAR SPINE COMPLETE 4+V
5 series · 5 of 5 positions shown · non-contrast
Comparison: None.

CLINICAL DATA: Severe pain after fall on brick steps

EXAM:
LUMBAR SPINE - COMPLETE 4+ VIEW

[lumbar spine ap]
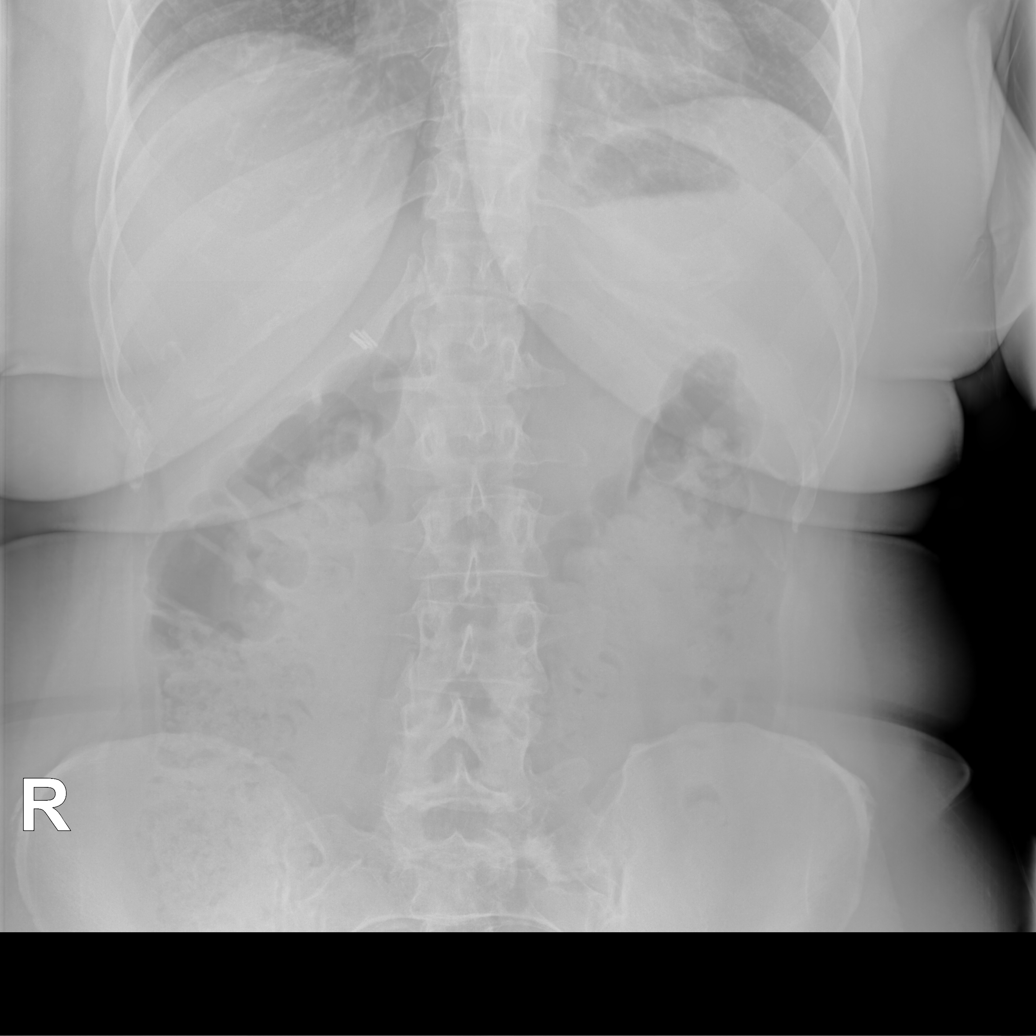

[lumbar spine oblique (1 of 2)]
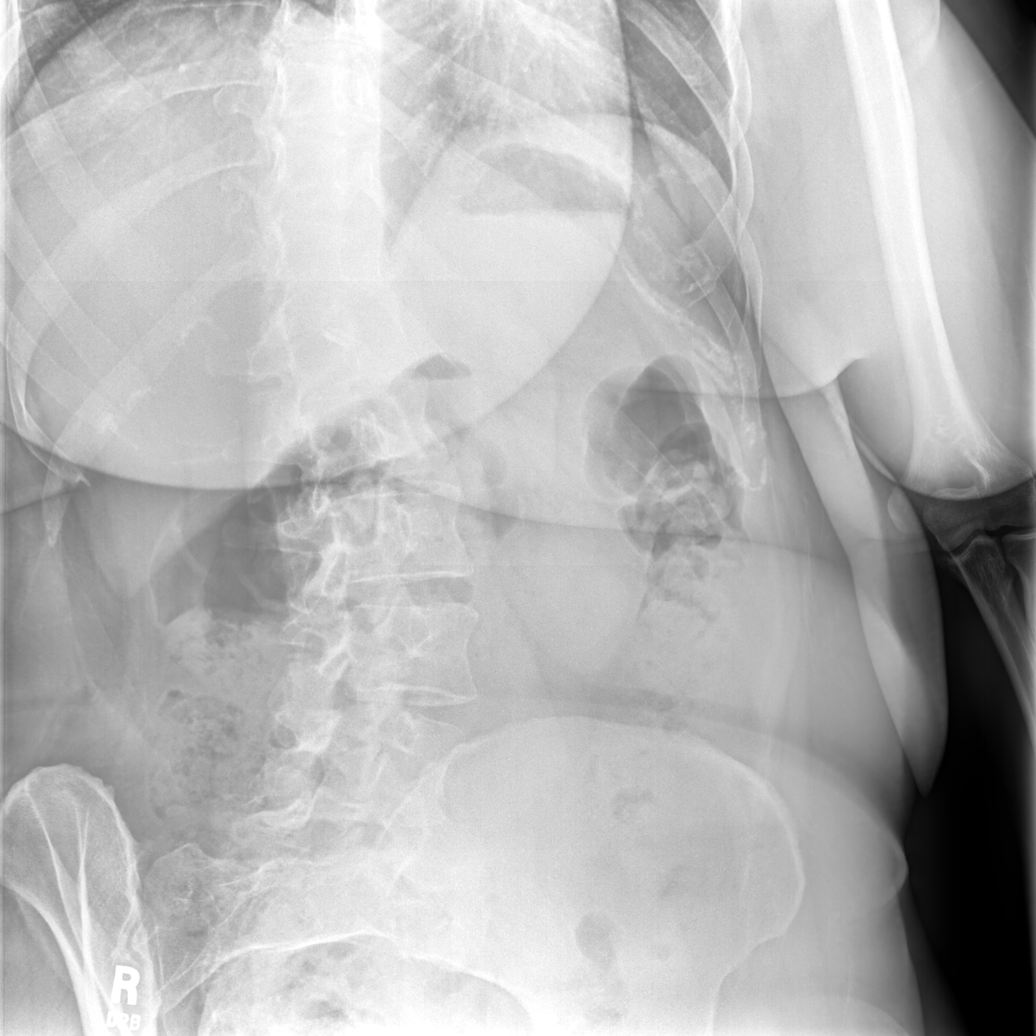

[lumbar spine oblique (2 of 2)]
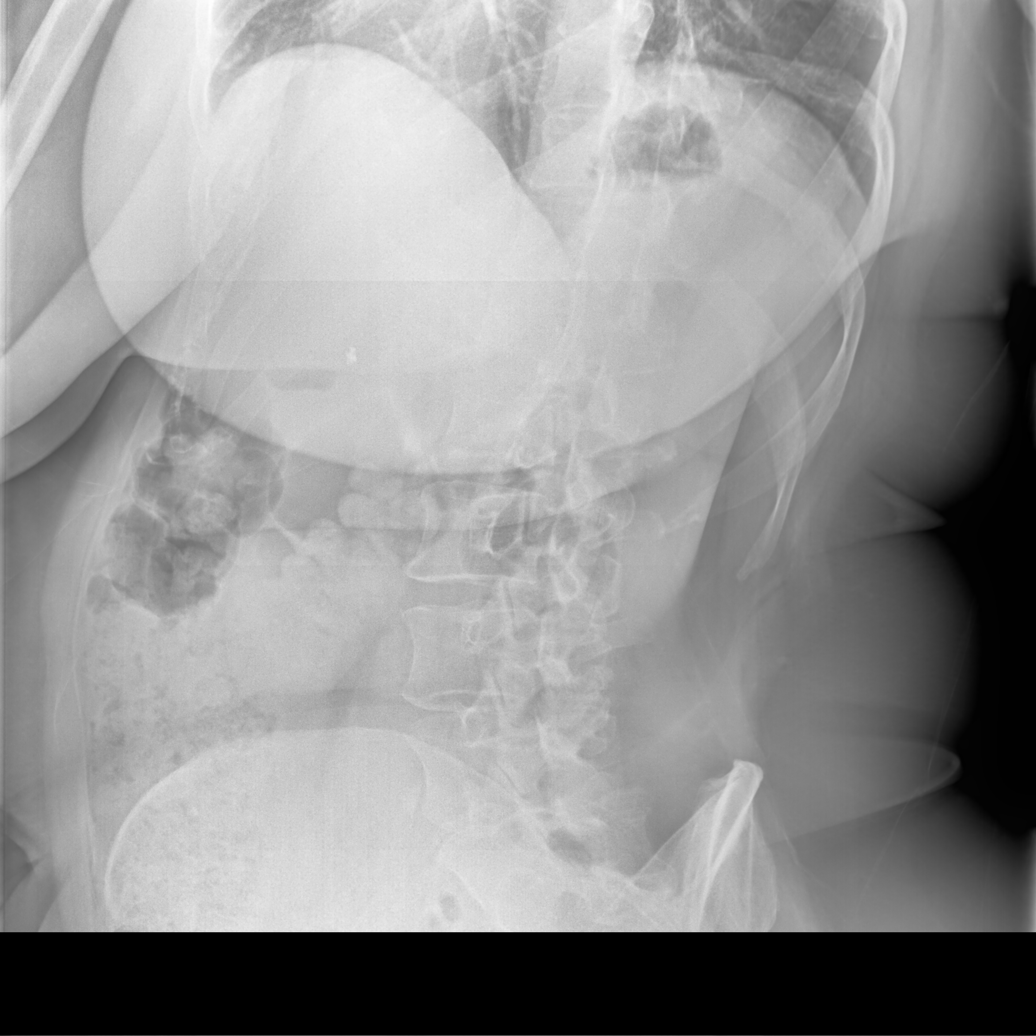

[lumbar spine lat (1 of 2)]
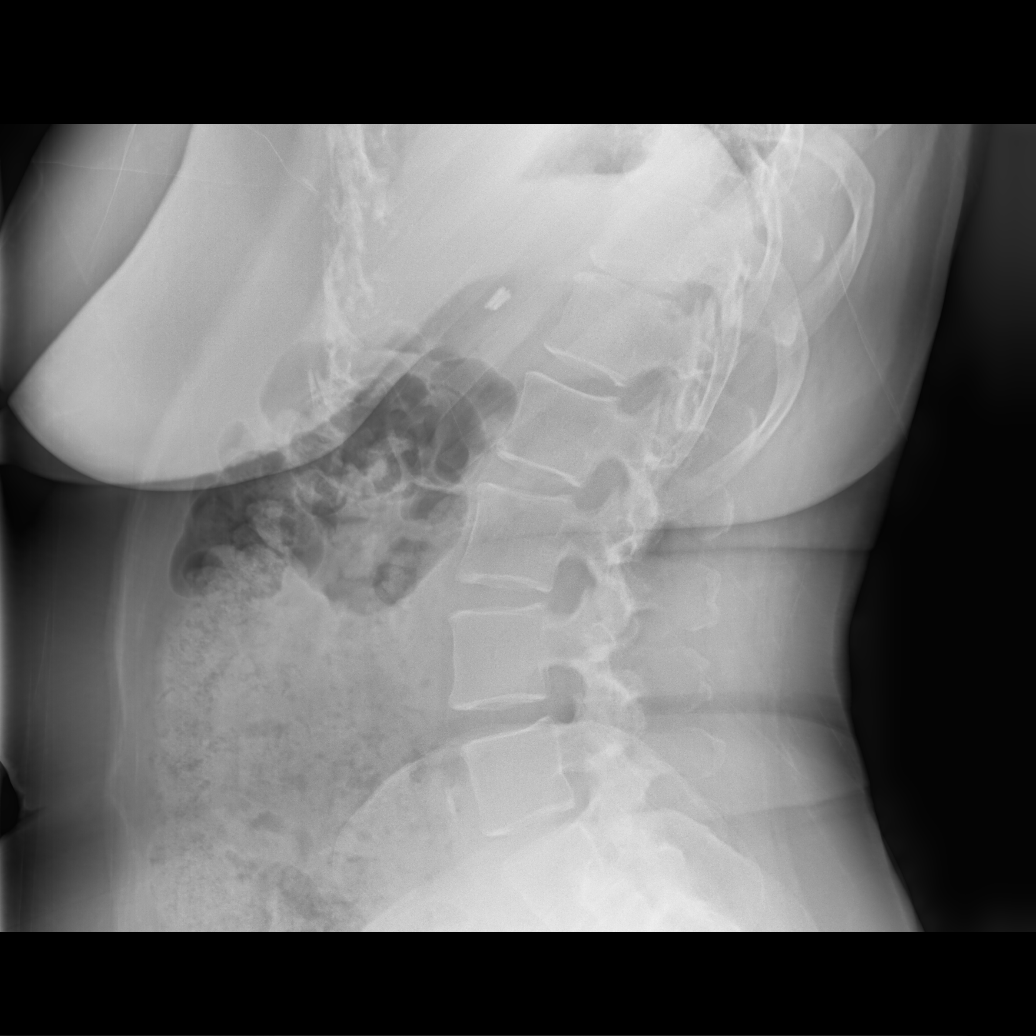

[lumbar spine lat (2 of 2)]
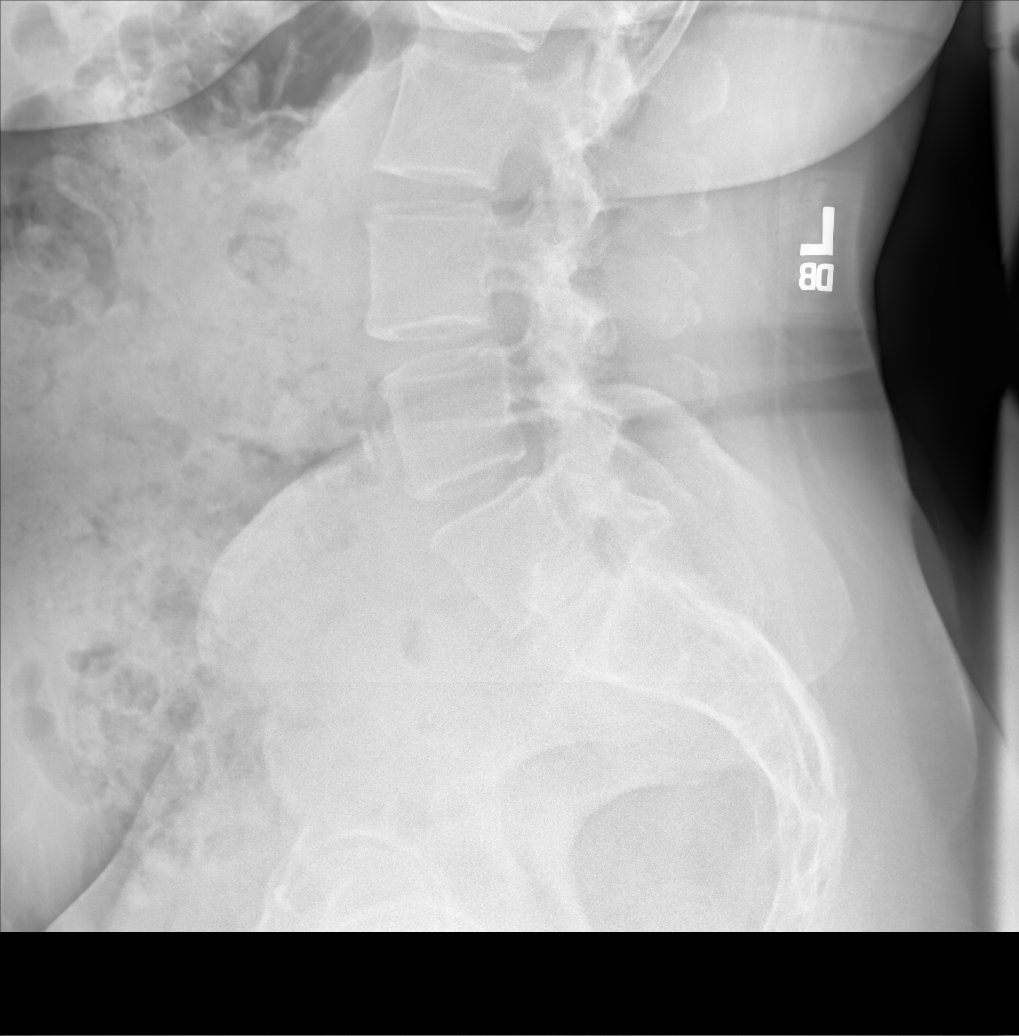

[5 of 5 positions shown; findings below may reference images not displayed]

FINDINGS: There is no evidence of lumbar spine fracture. Alignment is normal.
Intervertebral disc spaces are maintained. Right upper quadrant
surgical clips.
IMPRESSION: Negative lumbar spine radiographs.

## 2022-03-25 ENCOUNTER — Other Ambulatory Visit: Payer: Self-pay | Admitting: Family Medicine

## 2022-03-25 DIAGNOSIS — R7989 Other specified abnormal findings of blood chemistry: Secondary | ICD-10-CM

## 2022-03-29 ENCOUNTER — Ambulatory Visit
Admission: RE | Admit: 2022-03-29 | Discharge: 2022-03-29 | Disposition: A | Discharge status: Home / Self Care | Payer: BC Managed Care – PPO | Source: Ambulatory Visit | Attending: Family Medicine | Admitting: Family Medicine

## 2022-03-29 DIAGNOSIS — R7989 Other specified abnormal findings of blood chemistry: Secondary | ICD-10-CM

## 2022-05-06 ENCOUNTER — Other Ambulatory Visit (HOSPITAL_COMMUNITY): Payer: Self-pay

## 2022-05-06 MED ORDER — LISDEXAMFETAMINE DIMESYLATE 30 MG PO CHEW
30.0000 mg | CHEWABLE_TABLET | Freq: Every morning | ORAL | 0 refills | Status: DC
  Filled 2022-05-06: qty 30, 30d supply, fill #0

## 2022-06-16 ENCOUNTER — Other Ambulatory Visit (HOSPITAL_COMMUNITY): Payer: Self-pay

## 2022-06-17 ENCOUNTER — Other Ambulatory Visit (HOSPITAL_COMMUNITY): Payer: Self-pay

## 2022-06-17 MED ORDER — LISDEXAMFETAMINE DIMESYLATE 30 MG PO CHEW
30.0000 mg | CHEWABLE_TABLET | Freq: Every morning | ORAL | 0 refills | Status: DC
  Filled 2022-06-17: qty 30, 30d supply, fill #0

## 2022-07-07 ENCOUNTER — Other Ambulatory Visit (HOSPITAL_COMMUNITY): Payer: Self-pay

## 2022-07-07 MED ORDER — LISDEXAMFETAMINE DIMESYLATE 60 MG PO CHEW
60.0000 mg | CHEWABLE_TABLET | Freq: Every morning | ORAL | 0 refills | Status: DC
  Filled 2022-07-07: qty 30, 30d supply, fill #0

## 2022-08-03 ENCOUNTER — Other Ambulatory Visit (HOSPITAL_COMMUNITY): Payer: Self-pay

## 2022-08-03 MED ORDER — LISDEXAMFETAMINE DIMESYLATE 60 MG PO CHEW
CHEWABLE_TABLET | ORAL | 0 refills | Status: DC
  Filled 2022-08-03: qty 30, 30d supply, fill #0

## 2022-08-06 ENCOUNTER — Other Ambulatory Visit (HOSPITAL_COMMUNITY): Payer: Self-pay

## 2022-08-06 ENCOUNTER — Other Ambulatory Visit: Payer: Self-pay

## 2022-09-28 ENCOUNTER — Other Ambulatory Visit (HOSPITAL_COMMUNITY): Payer: Self-pay

## 2022-09-28 MED ORDER — LISDEXAMFETAMINE DIMESYLATE 60 MG PO CHEW
60.0000 mg | CHEWABLE_TABLET | Freq: Every morning | ORAL | 0 refills | Status: DC
  Filled 2022-09-28: qty 30, 30d supply, fill #0

## 2022-10-25 ENCOUNTER — Other Ambulatory Visit (HOSPITAL_COMMUNITY): Payer: Self-pay

## 2022-10-25 MED ORDER — SPIRONOLACTONE 25 MG PO TABS
ORAL_TABLET | ORAL | 0 refills | Status: DC
  Filled 2022-10-25: qty 30, 15d supply, fill #0

## 2022-10-26 ENCOUNTER — Other Ambulatory Visit (HOSPITAL_COMMUNITY): Payer: Self-pay

## 2022-11-01 ENCOUNTER — Other Ambulatory Visit (HOSPITAL_COMMUNITY): Payer: Self-pay

## 2022-11-01 MED ORDER — MONTELUKAST SODIUM 10 MG PO TABS
10.0000 mg | ORAL_TABLET | Freq: Every day | ORAL | 3 refills | Status: DC
  Filled 2022-11-01: qty 90, 90d supply, fill #0
  Filled 2023-02-22: qty 90, 90d supply, fill #1
  Filled 2023-05-29: qty 90, 90d supply, fill #2
  Filled 2023-09-19: qty 90, 90d supply, fill #3

## 2022-11-02 ENCOUNTER — Other Ambulatory Visit (HOSPITAL_COMMUNITY): Payer: Self-pay

## 2022-11-02 MED ORDER — LISDEXAMFETAMINE DIMESYLATE 60 MG PO CHEW
60.0000 mg | CHEWABLE_TABLET | Freq: Every day | ORAL | 0 refills | Status: DC
  Filled 2022-11-02 – 2022-11-12 (×2): qty 30, 30d supply, fill #0

## 2022-11-05 ENCOUNTER — Other Ambulatory Visit (HOSPITAL_COMMUNITY): Payer: Self-pay

## 2022-11-12 ENCOUNTER — Other Ambulatory Visit (HOSPITAL_COMMUNITY): Payer: Self-pay

## 2022-11-22 ENCOUNTER — Encounter: Payer: Self-pay | Admitting: Family Medicine

## 2022-12-02 ENCOUNTER — Other Ambulatory Visit (HOSPITAL_COMMUNITY): Payer: Self-pay

## 2022-12-02 MED ORDER — SPIRONOLACTONE 25 MG PO TABS
25.0000 mg | ORAL_TABLET | Freq: Every day | ORAL | 3 refills | Status: DC
  Filled 2022-12-02: qty 30, 15d supply, fill #0

## 2022-12-03 ENCOUNTER — Other Ambulatory Visit (HOSPITAL_COMMUNITY): Payer: Self-pay

## 2023-01-10 ENCOUNTER — Other Ambulatory Visit (HOSPITAL_COMMUNITY): Payer: Self-pay

## 2023-01-10 MED ORDER — SPIRONOLACTONE 25 MG PO TABS
ORAL_TABLET | ORAL | 3 refills | Status: DC
  Filled 2023-01-10: qty 30, 15d supply, fill #0
  Filled 2023-02-22: qty 30, 15d supply, fill #1
  Filled 2023-04-04: qty 30, 15d supply, fill #2

## 2023-02-21 ENCOUNTER — Other Ambulatory Visit (HOSPITAL_COMMUNITY): Payer: Self-pay

## 2023-02-21 MED ORDER — TORSEMIDE 20 MG PO TABS
20.0000 mg | ORAL_TABLET | Freq: Every day | ORAL | 0 refills | Status: DC
  Filled 2023-02-21: qty 180, 90d supply, fill #0

## 2023-02-21 MED ORDER — LISDEXAMFETAMINE DIMESYLATE 60 MG PO CHEW
60.0000 mg | CHEWABLE_TABLET | Freq: Every morning | ORAL | 0 refills | Status: DC
Start: 2023-02-21 — End: 2023-04-03
  Filled 2023-02-21: qty 30, 30d supply, fill #0

## 2023-02-22 ENCOUNTER — Other Ambulatory Visit (HOSPITAL_COMMUNITY): Payer: Self-pay

## 2023-02-23 ENCOUNTER — Other Ambulatory Visit (HOSPITAL_COMMUNITY): Payer: Self-pay

## 2023-04-03 ENCOUNTER — Other Ambulatory Visit (HOSPITAL_COMMUNITY): Payer: Self-pay

## 2023-04-03 MED ORDER — LISDEXAMFETAMINE DIMESYLATE 60 MG PO CHEW
60.0000 mg | CHEWABLE_TABLET | Freq: Every morning | ORAL | 0 refills | Status: DC
  Filled 2023-04-03: qty 30, 30d supply, fill #0

## 2023-04-04 ENCOUNTER — Other Ambulatory Visit (HOSPITAL_COMMUNITY): Payer: Self-pay

## 2023-05-12 ENCOUNTER — Other Ambulatory Visit (HOSPITAL_COMMUNITY): Payer: Self-pay

## 2023-05-12 MED ORDER — SPIRONOLACTONE 25 MG PO TABS
25.0000 mg | ORAL_TABLET | Freq: Every day | ORAL | 3 refills | Status: DC
  Filled 2023-05-12 – 2023-05-29 (×2): qty 30, 15d supply, fill #0
  Filled 2023-07-12: qty 30, 15d supply, fill #1

## 2023-05-23 ENCOUNTER — Other Ambulatory Visit (HOSPITAL_COMMUNITY): Payer: Self-pay

## 2023-05-29 ENCOUNTER — Other Ambulatory Visit (HOSPITAL_COMMUNITY): Payer: Self-pay

## 2023-05-30 ENCOUNTER — Other Ambulatory Visit: Payer: Self-pay

## 2023-05-30 ENCOUNTER — Other Ambulatory Visit (HOSPITAL_COMMUNITY): Payer: Self-pay

## 2023-05-30 MED ORDER — TORSEMIDE 20 MG PO TABS
20.0000 mg | ORAL_TABLET | Freq: Every day | ORAL | 0 refills | Status: DC | PRN
  Filled 2023-05-30: qty 180, 90d supply, fill #0

## 2023-07-12 ENCOUNTER — Other Ambulatory Visit (HOSPITAL_COMMUNITY): Payer: Self-pay

## 2023-07-12 MED ORDER — ALBUTEROL SULFATE HFA 108 (90 BASE) MCG/ACT IN AERS
1.0000 | INHALATION_SPRAY | RESPIRATORY_TRACT | 3 refills | Status: AC | PRN
Start: 2023-07-12 — End: ?
  Filled 2023-07-12: qty 6.7, 33d supply, fill #0

## 2023-08-19 ENCOUNTER — Other Ambulatory Visit (HOSPITAL_COMMUNITY): Payer: Self-pay

## 2023-08-19 MED ORDER — SPIRONOLACTONE 25 MG PO TABS
25.0000 mg | ORAL_TABLET | Freq: Every day | ORAL | 1 refills | Status: DC
  Filled 2023-08-19: qty 90, 90d supply, fill #0
  Filled 2023-11-07: qty 90, 90d supply, fill #1

## 2023-08-19 MED ORDER — KETOROLAC TROMETHAMINE 10 MG PO TABS
10.0000 mg | ORAL_TABLET | Freq: Every day | ORAL | 0 refills | Status: DC | PRN
  Filled 2023-08-19: qty 6, 6d supply, fill #0

## 2023-08-19 MED ORDER — HYDROXYZINE HCL 25 MG PO TABS
25.0000 mg | ORAL_TABLET | Freq: Every evening | ORAL | 0 refills | Status: DC | PRN
  Filled 2023-08-19: qty 6, 6d supply, fill #0

## 2023-08-22 ENCOUNTER — Other Ambulatory Visit (HOSPITAL_COMMUNITY): Payer: Self-pay

## 2023-08-22 MED ORDER — LISDEXAMFETAMINE DIMESYLATE 60 MG PO CHEW
60.0000 mg | CHEWABLE_TABLET | Freq: Every morning | ORAL | 0 refills | Status: DC
  Filled 2023-08-22: qty 30, 30d supply, fill #0

## 2023-09-05 ENCOUNTER — Other Ambulatory Visit (HOSPITAL_COMMUNITY): Payer: Self-pay

## 2023-09-05 MED ORDER — LISDEXAMFETAMINE DIMESYLATE 60 MG PO CHEW
60.0000 mg | CHEWABLE_TABLET | Freq: Every morning | ORAL | 0 refills | Status: DC
  Filled 2023-11-07: qty 30, 30d supply, fill #0

## 2023-09-05 MED ORDER — LISDEXAMFETAMINE DIMESYLATE 60 MG PO CHEW
60.0000 mg | CHEWABLE_TABLET | Freq: Every morning | ORAL | 0 refills | Status: DC
  Filled 2023-09-05 – 2023-09-20 (×3): qty 30, 30d supply, fill #0

## 2023-09-19 ENCOUNTER — Other Ambulatory Visit (HOSPITAL_COMMUNITY): Payer: Self-pay

## 2023-09-19 ENCOUNTER — Other Ambulatory Visit: Payer: Self-pay

## 2023-09-20 ENCOUNTER — Other Ambulatory Visit (HOSPITAL_COMMUNITY): Payer: Self-pay

## 2023-09-21 ENCOUNTER — Other Ambulatory Visit (HOSPITAL_COMMUNITY): Payer: Self-pay

## 2023-09-21 MED ORDER — LISDEXAMFETAMINE DIMESYLATE 60 MG PO CHEW
60.0000 mg | CHEWABLE_TABLET | Freq: Every morning | ORAL | 0 refills | Status: DC
  Filled 2023-11-07: qty 30, 30d supply, fill #0

## 2023-11-07 ENCOUNTER — Other Ambulatory Visit: Payer: Self-pay

## 2023-11-07 ENCOUNTER — Other Ambulatory Visit (HOSPITAL_COMMUNITY): Payer: Self-pay

## 2023-12-06 ENCOUNTER — Other Ambulatory Visit (HOSPITAL_COMMUNITY): Payer: Self-pay

## 2023-12-06 MED ORDER — LISDEXAMFETAMINE DIMESYLATE 60 MG PO CHEW
60.0000 mg | CHEWABLE_TABLET | Freq: Every morning | ORAL | 0 refills | Status: DC
  Filled 2023-12-06 – 2023-12-23 (×3): qty 30, 30d supply, fill #0

## 2023-12-07 ENCOUNTER — Other Ambulatory Visit: Payer: Self-pay

## 2023-12-07 ENCOUNTER — Other Ambulatory Visit (HOSPITAL_COMMUNITY): Payer: Self-pay

## 2023-12-15 ENCOUNTER — Other Ambulatory Visit (HOSPITAL_COMMUNITY): Payer: Self-pay

## 2023-12-19 ENCOUNTER — Other Ambulatory Visit (HOSPITAL_COMMUNITY): Payer: Self-pay

## 2023-12-19 MED ORDER — MONTELUKAST SODIUM 10 MG PO TABS
10.0000 mg | ORAL_TABLET | Freq: Every day | ORAL | 0 refills | Status: DC
  Filled 2023-12-19: qty 90, 90d supply, fill #0

## 2023-12-22 ENCOUNTER — Other Ambulatory Visit (HOSPITAL_COMMUNITY): Payer: Self-pay

## 2023-12-23 ENCOUNTER — Other Ambulatory Visit (HOSPITAL_COMMUNITY): Payer: Self-pay

## 2024-01-31 ENCOUNTER — Other Ambulatory Visit: Payer: Self-pay | Admitting: Medical Genetics

## 2024-02-06 ENCOUNTER — Encounter: Payer: Self-pay | Admitting: Family Medicine

## 2024-02-06 ENCOUNTER — Other Ambulatory Visit (HOSPITAL_COMMUNITY): Payer: Self-pay

## 2024-02-06 ENCOUNTER — Ambulatory Visit: Payer: Self-pay | Admitting: Family Medicine

## 2024-02-06 ENCOUNTER — Other Ambulatory Visit: Payer: Self-pay

## 2024-02-06 VITALS — BP 112/74 | HR 77 | Ht 63.0 in | Wt 180.8 lb

## 2024-02-06 DIAGNOSIS — Z79899 Other long term (current) drug therapy: Secondary | ICD-10-CM

## 2024-02-06 DIAGNOSIS — Z862 Personal history of diseases of the blood and blood-forming organs and certain disorders involving the immune mechanism: Secondary | ICD-10-CM

## 2024-02-06 DIAGNOSIS — Z006 Encounter for examination for normal comparison and control in clinical research program: Secondary | ICD-10-CM

## 2024-02-06 DIAGNOSIS — I341 Nonrheumatic mitral (valve) prolapse: Secondary | ICD-10-CM | POA: Insufficient documentation

## 2024-02-06 DIAGNOSIS — F5081 Binge eating disorder, mild: Secondary | ICD-10-CM

## 2024-02-06 DIAGNOSIS — Z23 Encounter for immunization: Secondary | ICD-10-CM

## 2024-02-06 DIAGNOSIS — Z6832 Body mass index (BMI) 32.0-32.9, adult: Secondary | ICD-10-CM

## 2024-02-06 DIAGNOSIS — R7303 Prediabetes: Secondary | ICD-10-CM

## 2024-02-06 DIAGNOSIS — E559 Vitamin D deficiency, unspecified: Secondary | ICD-10-CM

## 2024-02-06 DIAGNOSIS — E538 Deficiency of other specified B group vitamins: Secondary | ICD-10-CM

## 2024-02-06 DIAGNOSIS — E66811 Obesity, class 1: Secondary | ICD-10-CM

## 2024-02-06 LAB — CBC WITH DIFFERENTIAL/PLATELET
Basophils Absolute: 0.1 K/uL (ref 0.0–0.1)
Basophils Relative: 0.7 % (ref 0.0–3.0)
Eosinophils Absolute: 0.2 K/uL (ref 0.0–0.7)
Eosinophils Relative: 2 % (ref 0.0–5.0)
HCT: 45.7 % (ref 36.0–46.0)
Hemoglobin: 15.1 g/dL — ABNORMAL HIGH (ref 12.0–15.0)
Lymphocytes Relative: 26.8 % (ref 12.0–46.0)
Lymphs Abs: 2.4 K/uL (ref 0.7–4.0)
MCHC: 33 g/dL (ref 30.0–36.0)
MCV: 88.7 fl (ref 78.0–100.0)
Monocytes Absolute: 0.4 K/uL (ref 0.1–1.0)
Monocytes Relative: 4.9 % (ref 3.0–12.0)
Neutro Abs: 5.8 K/uL (ref 1.4–7.7)
Neutrophils Relative %: 65.6 % (ref 43.0–77.0)
Platelets: 255 K/uL (ref 150.0–400.0)
RBC: 5.16 Mil/uL — ABNORMAL HIGH (ref 3.87–5.11)
RDW: 14.3 % (ref 11.5–15.5)
WBC: 8.9 K/uL (ref 4.0–10.5)

## 2024-02-06 LAB — COMPREHENSIVE METABOLIC PANEL WITH GFR
ALT: 47 U/L — ABNORMAL HIGH (ref 0–35)
AST: 32 U/L (ref 0–37)
Albumin: 4.6 g/dL (ref 3.5–5.2)
Alkaline Phosphatase: 71 U/L (ref 39–117)
BUN: 13 mg/dL (ref 6–23)
CO2: 30 meq/L (ref 19–32)
Calcium: 9.6 mg/dL (ref 8.4–10.5)
Chloride: 99 meq/L (ref 96–112)
Creatinine, Ser: 0.79 mg/dL (ref 0.40–1.20)
GFR: 85.21 mL/min (ref >60.00)
Glucose, Bld: 104 mg/dL — ABNORMAL HIGH (ref 70–99)
Potassium: 3.9 meq/L (ref 3.5–5.1)
Sodium: 138 meq/L (ref 135–145)
Total Bilirubin: 0.6 mg/dL (ref 0.2–1.2)
Total Protein: 7.1 g/dL (ref 6.0–8.3)

## 2024-02-06 LAB — HEMOGLOBIN A1C: Hgb A1c MFr Bld: 5.7 % (ref 4.6–6.5)

## 2024-02-06 LAB — LIPID PANEL
Cholesterol: 209 mg/dL — ABNORMAL HIGH (ref 0–200)
HDL: 54.4 mg/dL (ref >39.00)
LDL Cholesterol: 135 mg/dL — ABNORMAL HIGH (ref 0–99)
NonHDL: 155.06
Total CHOL/HDL Ratio: 4
Triglycerides: 100 mg/dL (ref 0.0–149.0)
VLDL: 20 mg/dL (ref 0.0–40.0)

## 2024-02-06 LAB — VITAMIN D 25 HYDROXY (VIT D DEFICIENCY, FRACTURES): VITD: 57.97 ng/mL (ref 30.00–100.00)

## 2024-02-06 LAB — TSH: TSH: 2.39 u[IU]/mL (ref 0.35–5.50)

## 2024-02-06 LAB — VITAMIN B12: Vitamin B-12: 993 pg/mL — ABNORMAL HIGH (ref 211–911)

## 2024-02-06 MED ORDER — TORSEMIDE 20 MG PO TABS
20.0000 mg | ORAL_TABLET | Freq: Every day | ORAL | 0 refills | Status: AC | PRN
  Filled 2024-02-06 – 2024-02-22 (×2): qty 180, 90d supply, fill #0

## 2024-02-06 MED ORDER — SPIRONOLACTONE 25 MG PO TABS
25.0000 mg | ORAL_TABLET | Freq: Every day | ORAL | 1 refills | Status: AC
  Filled 2024-02-06 – 2024-02-22 (×2): qty 90, 90d supply, fill #0

## 2024-02-06 MED ORDER — MONTELUKAST SODIUM 10 MG PO TABS
10.0000 mg | ORAL_TABLET | Freq: Every day | ORAL | 0 refills | Status: AC
  Filled 2024-02-06 – 2024-03-18 (×2): qty 90, 90d supply, fill #0

## 2024-02-06 NOTE — Progress Notes (Signed)
 1. Prediabetes   2. B12 deficiency   3. Research study patient   4. Mild binge-eating disorder   5. Medication management   6. Vitamin D  deficiency   7. Flu vaccine need   8. History of iron deficiency anemia   9. Class 1 obesity with serious comorbidity and body mass index (BMI) of 32.0 to 32.9 in adult, unspecified obesity type    Meds ordered this encounter  Medications   spironolactone  (ALDACTONE ) 25 MG tablet    Sig: Take 1 tablet (25 mg total) by mouth daily.    Dispense:  90 tablet    Refill:  1   torsemide  (DEMADEX ) 20 MG tablet    Sig: Take 1-2 tablets (20-40 mg total) by mouth daily as needed.    Dispense:  180 tablet    Refill:  0   montelukast  (SINGULAIR ) 10 MG tablet    Sig: Take 1 tablet (10 mg total) by mouth daily.    Dispense:  90 tablet    Refill:  0    Singulair    Orders Placed This Encounter  Procedures   Flu vaccine trivalent PF, 6mos and older(Flulaval,Afluria,Fluarix,Fluzone)   GeneConnect Molecular Screen - Blood   Comprehensive metabolic panel with GFR   CBC with Differential/Platelet   Lipid panel   Hemoglobin A1c   TSH   VITAMIN D  25 Hydroxy (Vit-D Deficiency, Fractures)   Vitamin B12   Iron, TIBC and Ferritin Panel   Assessment and Plan Assessment & Plan Obesity and weight management Significant weight loss due to dietary changes and increased physical activity. Vyvanse  aids in meal portion management and polyphagia. - Continue Vyvanse  for meal portion management and polyphagia. - Encouraged continued dietary modifications and physical activity.  Attention-deficit hyperactivity disorder, predominantly inattentive type Vyvanse  used for binge eating management, not ADHD symptoms. - Continue Vyvanse  as prescribed.  Edema related to dietary sensitivities Edema linked to dietary sensitivities. Dietary modifications and compression socks effective in reducing edema. - Continue dietary modifications to avoid known triggers. - Continue use  of compression socks as needed.  Abnormal uterine bleeding and cervical stenosis On estrogen patch and oral progesterone post-IUD removal. Previous ultrasound showed thickened endometrium. Family history of endometrial cancer noted. - Continue estrogen patch and oral progesterone. - Consider Gene Connect testing for further evaluation.  General health maintenance Emphasized dietary changes and weight management with a low-inflammatory diet and regular physical activity. Considered progesterone for sleep and anxiety management. - Continue low-inflammatory diet and regular physical activity. - Consider progesterone for sleep and anxiety management.  Geni Shutter, DO, MS, FAAFP, Dipl. [facility] [address] [place], [address] Dept: [phone] Dept Fax: [phone]  Subjective:   Patient is well-known to me from previous care setting and is establishing care in this system with me as PCP. Prior records reviewed when available. Chart updated today with reconciliation of problem list, medications, allergies, and relevant history. Preventive care and chronic disease status reviewed. Portions of historical chart may remain incomplete; will update on an ongoing basis as clinically indicated.  Discussed the use of AI scribe software for clinical note transcription with the patient, who gave verbal consent to proceed. History of Present Illness Carmen Simon is a 53 year old female who presents for follow-up on dietary management and medication refills.  Dietary modification and weight loss - Lost approximately 13 to 14 pounds since last visit due to dietary changes following a food sensitivity test - Adheres to  an elimination diet - Experiences edema with reintroduction of foods such as chicken, eggs, dairy, phenylethylamine, Brussels sprouts, and asparagus - Manages edema with compression socks  Hormone therapy and menstrual  history - Currently taking Vyvanse  and combination estrogen/progesterone therapy - Uses 0.5 mg estrogen patch and 100 mg oral progesterone - Hormone therapy regimen adjusted following significant bleeding after IUD removal  Exercise and muscle strength - Increased Pilates sessions to twice weekly - Improved muscle strength despite initial muscle loss - Focusing on maintaining adequate protein intake  Peripheral cold sensation - Cold hands and feet, associated with recent weight loss  Cancer risk assessment - Considering genetic testing for hereditary cancer risk due to sister's recent diagnosis of endometrial cancer  Laboratory preparation - Fasting today in preparation for laboratory work  Review of Systems: Negative, with the exception of above mentioned in HPI.  Current Outpatient Medications:    albuterol  (VENTOLIN  HFA) 108 (90 Base) MCG/ACT inhaler, Inhale 1 puff into the lungs every 4 (four) hours as needed for cough, shortness of breath, or wheezing., Disp: 6.7 g, Rfl: 3   cetirizine (ZYRTEC) 10 MG tablet, Take 10 mg by mouth every morning. , Disp: , Rfl:    Cholecalciferol (VITAMIN D3) 2000 UNITS TABS, Take 1 tablet by mouth daily. , Disp: , Rfl:    Lisdexamfetamine Dimesylate  60 MG CHEW, Chew 1 tablet (60 mg total) by mouth every morning., Disp: 30 tablet, Rfl: 0   potassium chloride  SA (KLOR-CON  M20) 20 MEQ tablet, Take 1 tablet (20 mEq total) by mouth 2 (two) times daily as needed. Take with lasix , Disp: 90 tablet, Rfl: 3   vitamin B-12 (CYANOCOBALAMIN) 500 MCG tablet, Take 500 mcg by mouth daily., Disp: , Rfl:    estradiol (VIVELLE-DOT) 0.05 MG/24HR patch, Place 1 patch onto the skin 2 (two) times a week., Disp: , Rfl:    Magnesium 100 MG CAPS, Magnesium, Disp: , Rfl:    montelukast  (SINGULAIR ) 10 MG tablet, Take 1 tablet (10 mg total) by mouth daily., Disp: 90 tablet, Rfl: 0   progesterone (PROMETRIUM) 100 MG capsule, Take 100 mg by mouth daily., Disp: , Rfl:     spironolactone  (ALDACTONE ) 25 MG tablet, Take 1 tablet (25 mg total) by mouth daily., Disp: 90 tablet, Rfl: 1   torsemide  (DEMADEX ) 20 MG tablet, Take 1-2 tablets (20-40 mg total) by mouth daily as needed., Disp: 180 tablet, Rfl: 0   Objective:   BP 112/74 (BP Location: Right Arm, Cuff Size: Normal)   Pulse 77   Ht 5' 3 (1.6 m)   Wt 180 lb 12.8 oz (82 kg)   SpO2 100%   BMI 32.03 kg/m   Wt Readings from Last 3 Encounters:  02/06/24 180 lb 12.8 oz (82 kg)  02/03/21 215 lb 9.6 oz (97.8 kg)  01/16/21 210 lb 1.6 oz (95.3 kg)   Physical Exam Constitutional:      General: She is not in acute distress.    Appearance: She is well-developed. She is obese.  HENT:     Head: Normocephalic and atraumatic.  Eyes:     Conjunctiva/sclera: Conjunctivae normal.  Cardiovascular:     Rate and Rhythm: Normal rate and regular rhythm.     Heart sounds: Normal heart sounds.  Pulmonary:     Effort: Pulmonary effort is normal.     Breath sounds: Normal breath sounds.  Musculoskeletal:     Cervical back: Normal range of motion and neck supple.  Neurological:     General: No  focal deficit present.     Mental Status: She is alert and oriented to person, place, and time.  Psychiatric:        Mood and Affect: Mood normal.        Behavior: Behavior normal.   I personally spent a total of 45 minutes in the care of the patient today including preparing to see the patient, performing a medically appropriate exam/evaluation, counseling and educating, placing orders, and documenting clinical information in the EHR.

## 2024-02-07 LAB — IRON,TIBC AND FERRITIN PANEL
%SAT: 31 % (ref 16–45)
Ferritin: 105 ng/mL (ref 16–232)
Iron: 101 ug/dL (ref 45–160)
TIBC: 324 ug/dL (ref 250–450)

## 2024-02-08 ENCOUNTER — Ambulatory Visit: Payer: Self-pay | Admitting: Family Medicine

## 2024-02-16 ENCOUNTER — Other Ambulatory Visit (HOSPITAL_COMMUNITY): Payer: Self-pay

## 2024-02-18 LAB — GENECONNECT MOLECULAR SCREEN

## 2024-02-22 ENCOUNTER — Other Ambulatory Visit (HOSPITAL_COMMUNITY): Payer: Self-pay

## 2024-03-12 ENCOUNTER — Other Ambulatory Visit (HOSPITAL_COMMUNITY): Payer: Self-pay

## 2024-03-12 MED ORDER — ESTRADIOL 0.05 MG/24HR TD PTTW
1.0000 | MEDICATED_PATCH | TRANSDERMAL | 0 refills | Status: AC
  Filled 2024-03-12: qty 24, 84d supply, fill #0

## 2024-03-15 ENCOUNTER — Other Ambulatory Visit

## 2024-03-19 ENCOUNTER — Ambulatory Visit: Admitting: Family Medicine

## 2024-03-19 ENCOUNTER — Encounter: Payer: Self-pay | Admitting: Family Medicine

## 2024-03-19 ENCOUNTER — Other Ambulatory Visit (HOSPITAL_COMMUNITY): Payer: Self-pay

## 2024-03-19 ENCOUNTER — Other Ambulatory Visit: Payer: Self-pay

## 2024-03-19 VITALS — BP 116/72 | HR 85 | Ht 63.0 in | Wt 180.0 lb

## 2024-03-19 DIAGNOSIS — Z6831 Body mass index (BMI) 31.0-31.9, adult: Secondary | ICD-10-CM

## 2024-03-19 DIAGNOSIS — E66811 Obesity, class 1: Secondary | ICD-10-CM

## 2024-03-19 DIAGNOSIS — L719 Rosacea, unspecified: Secondary | ICD-10-CM | POA: Insufficient documentation

## 2024-03-19 DIAGNOSIS — K9049 Malabsorption due to intolerance, not elsewhere classified: Secondary | ICD-10-CM

## 2024-03-19 DIAGNOSIS — Z85828 Personal history of other malignant neoplasm of skin: Secondary | ICD-10-CM | POA: Insufficient documentation

## 2024-03-19 DIAGNOSIS — Z808 Family history of malignant neoplasm of other organs or systems: Secondary | ICD-10-CM | POA: Insufficient documentation

## 2024-03-19 DIAGNOSIS — F5081 Binge eating disorder, mild: Secondary | ICD-10-CM

## 2024-03-19 DIAGNOSIS — M546 Pain in thoracic spine: Secondary | ICD-10-CM

## 2024-03-19 MED ORDER — LISDEXAMFETAMINE DIMESYLATE 60 MG PO CHEW
60.0000 mg | CHEWABLE_TABLET | Freq: Every morning | ORAL | 0 refills | Status: AC
  Filled 2024-03-19 (×2): qty 30, 30d supply, fill #0

## 2024-03-19 NOTE — Progress Notes (Signed)
 "    Patient Care Team: Prentiss Frieze, DO as PCP - General (Family Medicine) Skeet Juliene SAUNDERS, DO as Consulting Physician (Neurology) Jodie Lavern CROME, MD as Consulting Physician (Family Medicine) Gorge Ade, MD (Inactive) as Consulting Physician (Obstetrics and Gynecology)  Diagnoses and Orders:   1. Acute left-sided thoracic back pain   2. Mild binge-eating disorder   3. Food intolerance   4. Class 1 obesity with serious comorbidity and body mass index (BMI) of 31.0 to 31.9 in adult, unspecified obesity type    Meds ordered this encounter  Medications   Lisdexamfetamine  Dimesylate 60 MG CHEW    Sig: Chew 1 tablet (60 mg total) by mouth every morning.    Dispense:  30 tablet    Refill:  0   No orders of the defined types were placed in this encounter.  Assessment & Plan:   Assessment & Plan Left back pain Likely atelectasis due to recent illness and decreased mobility. Differential includes pneumonia if symptoms worsen. - Encouraged deep breathing exercises. - Advised use of albuterol  inhaler. - Instructed to monitor for worsening symptoms and report if she occurs.  Class 1 obesity with serious comorbidity Weight well-managed despite recent dietary indiscretions. Aware of dietary restrictions and working on healthier diet. Engaged in Pilates but missed sessions. - Continue dietary management focusing on protein and fiber. - Encouraged resumption of regular Pilates sessions.  Binge eating disorder Managing ADHD with Vyvanse . Reports improvement but needs consistent medication adherence. - Refilled Vyvanse  prescription. - Encouraged consistent medication adherence.  Edema related to dietary sensitivities Ongoing management with dietary modifications. Identified specific food sensitivities. - Continue dietary modifications to manage edema.  General Health Maintenance Managing health with focus on diet and exercise. Engaged in Pilates and aware of dietary  restrictions. - Continue current health maintenance practices, including diet and exercise.  Frieze Prentiss, DO, MS, FAAFP, Dipl. [facility] [address] [place], [address] Dept: [phone] Dept Fax: [phone]  Subjective:   History of Present Illness Carmen Simon is a 54 year old female who presents with fatigue and chest pain.  Fatigue - Severe fatigue onset since Friday - Exhaustion by midday requiring frequent rest throughout the day - Similar exhaustion experienced by husband and a friend - No associated fever or upper respiratory symptoms  Chest pain - Aching pain localized to the upper chest - Pain onset coincides with fatigue since Friday  Voice changes - Weaker voice noted since onset of current illness  Respiratory symptoms - No significant upper respiratory symptoms - No change in cough  Recent activity - Recently resumed Pilates after a month-long break  Medication use - Current medications include Vyvanse , potassium, estradiol , and progesterone - Has an inhaler but has not used it recently  Review of Systems: Negative, with the exception of above mentioned in HPI.  History:   Reviewed by clinician on day of visit: allergies, medications, problem list, medical history, surgical history, family history, social history, and previous encounter notes.  Medications:   Show/hide medication list[1] Allergies[2]  Objective:   BP 116/72 (BP Location: Left Arm, Cuff Size: Large)   Pulse 85   Ht 5' 3 (1.6 m)   Wt 180 lb (81.6 kg)   LMP  (LMP Unknown)   SpO2 98%   BMI 31.89 kg/m   Physical Exam Constitutional:      General: She is not in acute distress.    Appearance: She is well-developed.  HENT:  Head: Normocephalic and atraumatic.  Eyes:     Conjunctiva/sclera: Conjunctivae normal.  Cardiovascular:     Rate and Rhythm: Normal rate and regular rhythm.     Heart sounds: Normal heart  sounds.  Pulmonary:     Effort: Pulmonary effort is normal.     Breath sounds: Normal breath sounds.  Neurological:     General: No focal deficit present.     Mental Status: She is alert.  Psychiatric:        Behavior: Behavior normal.    Results for orders placed or performed in visit on 02/06/24  Comprehensive metabolic panel with GFR   Collection Time: 02/06/24  9:20 AM  Result Value Ref Range   Sodium 138 135 - 145 mEq/L   Potassium 3.9 3.5 - 5.1 mEq/L   Chloride 99 96 - 112 mEq/L   CO2 30 19 - 32 mEq/L   Glucose, Bld 104 (H) 70 - 99 mg/dL   BUN 13 6 - 23 mg/dL   Creatinine, Ser 9.20 0.40 - 1.20 mg/dL   Total Bilirubin 0.6 0.2 - 1.2 mg/dL   Alkaline Phosphatase 71 39 - 117 U/L   AST 32 0 - 37 U/L   ALT 47 (H) 0 - 35 U/L   Total Protein 7.1 6.0 - 8.3 g/dL   Albumin 4.6 3.5 - 5.2 g/dL   GFR 14.78 >39.99 mL/min   Calcium 9.6 8.4 - 10.5 mg/dL  CBC with Differential/Platelet   Collection Time: 02/06/24  9:20 AM  Result Value Ref Range   WBC 8.9 4.0 - 10.5 K/uL   RBC 5.16 (H) 3.87 - 5.11 Mil/uL   Hemoglobin 15.1 (H) 12.0 - 15.0 g/dL   HCT 54.2 63.9 - 53.9 %   MCV 88.7 78.0 - 100.0 fl   MCHC 33.0 30.0 - 36.0 g/dL   RDW 85.6 88.4 - 84.4 %   Platelets 255.0 150.0 - 400.0 K/uL   Neutrophils Relative % 65.6 43.0 - 77.0 %   Lymphocytes Relative 26.8 12.0 - 46.0 %   Monocytes Relative 4.9 3.0 - 12.0 %   Eosinophils Relative 2.0 0.0 - 5.0 %   Basophils Relative 0.7 0.0 - 3.0 %   Neutro Abs 5.8 1.4 - 7.7 K/uL   Lymphs Abs 2.4 0.7 - 4.0 K/uL   Monocytes Absolute 0.4 0.1 - 1.0 K/uL   Eosinophils Absolute 0.2 0.0 - 0.7 K/uL   Basophils Absolute 0.1 0.0 - 0.1 K/uL  Lipid panel   Collection Time: 02/06/24  9:20 AM  Result Value Ref Range   Cholesterol 209 (H) 0 - 200 mg/dL   Triglycerides 899.9 0.0 - 149.0 mg/dL   HDL 45.59 >60.99 mg/dL   VLDL 79.9 0.0 - 59.9 mg/dL   LDL Cholesterol 864 (H) 0 - 99 mg/dL   Total CHOL/HDL Ratio 4    NonHDL 155.06   Hemoglobin A1c    Collection Time: 02/06/24  9:20 AM  Result Value Ref Range   Hgb A1c MFr Bld 5.7 4.6 - 6.5 %  TSH   Collection Time: 02/06/24  9:20 AM  Result Value Ref Range   TSH 2.39 0.35 - 5.50 uIU/mL  VITAMIN D  25 Hydroxy (Vit-D Deficiency, Fractures)   Collection Time: 02/06/24  9:20 AM  Result Value Ref Range   VITD 57.97 30.00 - 100.00 ng/mL  Vitamin B12   Collection Time: 02/06/24  9:20 AM  Result Value Ref Range   Vitamin B-12 993 (H) 211 - 911 pg/mL  Iron, TIBC and Ferritin  Panel   Collection Time: 02/06/24  9:20 AM  Result Value Ref Range   Iron 101 45 - 160 mcg/dL   TIBC 675 749 - 549 mcg/dL (calc)   %SAT 31 16 - 45 % (calc)   Ferritin 105 16 - 232 ng/mL  GeneConnect Molecular Screen - Blood   Collection Time: 02/06/24  9:21 AM  Result Value Ref Range   Genetic Analysis Overall Interpretation Negative    Genetic Disease Assessed      This is a screening test and does not detect all pathogenic or likely pathogenic variant(s) in the tested genes; diagnostic testing is recommended for individuals with a personal or family history of heart disease or hereditary cancer. [employer] Tier One  Population Screen is a screening test that analyzes 11 genes related to hereditary breast and ovarian cancer (HBOC) syndrome, Lynch syndrome, and familial hypercholesterolemia. This test only reports clinically significant pathogenic and likely  pathogenic variants but does not report variants of uncertain significance (VUS). In addition, analysis of the PMS2 gene excludes exons 11-15, which overlap with a known pseudogene (PMS2CL).    Genetic Analysis Report      No pathogenic or likely pathogenic variants were detected in the genes analyzed by this test.Genetic test results should be interpreted in the context of an individual's personal medical and family history. Alteration to medical management is NOT  recommended based solely on this result. Clinical correlation is advised.Additional Considerations- This  is a screening test; individuals may still carry pathogenic or likely pathogenic variant(s) in the tested genes that are not detected by this test.-  For individuals at risk for these or other related conditions based on factors including personal or family history, diagnostic testing is recommended.- The absence of pathogenic or likely pathogenic variant(s) in the analyzed genes, while reassuring,  does not eliminate the possibility of a hereditary condition; there are other variants and genes associated with heart disease and hereditary cancer that are not included in this test.    Genes Tested See Notes    Disclaimer See Notes    Sequencing Location See Notes    Interpretation Methods and Limitations See Notes     Attestations:   Reviewed by clinician on day of visit: allergies, medications, problem list, medical history, surgical history, family history, social history, and previous encounter notes. As the patient's primary care physician, board-certified in Family Medicine and Obesity Medicine, I am providing ongoing, comprehensive obesity care based on the pillars of obesity medicine, including nutrition therapy, physical activity, behavioral modification, and pharmacologic treatment.  Geni Shutter, DO, MS, FAAFP, Dipl. [facility] at [facility] [address] [place], [address] Dept: [phone] Dept Fax: [phone]     [1]  Outpatient Medications Prior to Visit  Medication Sig   albuterol  (VENTOLIN  HFA) 108 (90 Base) MCG/ACT inhaler Inhale 1 puff into the lungs every 4 (four) hours as needed for cough, shortness of breath, or wheezing.   cetirizine (ZYRTEC) 10 MG tablet Take 10 mg by mouth every morning.    Cholecalciferol (VITAMIN D3) 2000 UNITS TABS Take 1 tablet by mouth daily.    estradiol  (VIVELLE -DOT) 0.05 MG/24HR patch Place 1 patch onto the skin 2 (two) times a week.   estradiol  (VIVELLE -DOT) 0.05 MG/24HR patch Place 1 patch (0.05 mg  total) onto the skin 2 (two) times a week.   Magnesium 100 MG CAPS Magnesium   montelukast  (SINGULAIR ) 10 MG tablet Take 1 tablet (10 mg total) by mouth daily.  potassium chloride  SA (KLOR-CON  M20) 20 MEQ tablet Take 1 tablet (20 mEq total) by mouth 2 (two) times daily as needed. Take with lasix    progesterone (PROMETRIUM) 100 MG capsule Take 100 mg by mouth daily.   spironolactone  (ALDACTONE ) 25 MG tablet Take 1 tablet (25 mg total) by mouth daily.   torsemide  (DEMADEX ) 20 MG tablet Take 1-2 tablets (20-40 mg total) by mouth daily as needed.   vitamin B-12 (CYANOCOBALAMIN ) 500 MCG tablet Take 500 mcg by mouth daily.   [DISCONTINUED] Lisdexamfetamine  Dimesylate 60 MG CHEW Chew 1 tablet (60 mg total) by mouth every morning.   No facility-administered medications prior to visit.  [2]  Allergies Allergen Reactions   Aspirin Tinitus   Gluten Meal Swelling    retains water'   Erythromycin Other (See Comments)    Stomach cramps   "

## 2024-04-30 ENCOUNTER — Ambulatory Visit: Admitting: Family Medicine
